# Patient Record
Sex: Female | Born: 1955 | Race: White | Hispanic: No | Marital: Married | Smoking: Former smoker
Health system: Southern US, Community
[De-identification: ages and names within clinical notes are randomized; demographics above are authoritative.]

## PROBLEM LIST (undated history)

## (undated) DIAGNOSIS — B009 Herpesviral infection, unspecified: Secondary | ICD-10-CM

## (undated) DIAGNOSIS — T7840XA Allergy, unspecified, initial encounter: Secondary | ICD-10-CM

## (undated) DIAGNOSIS — H905 Unspecified sensorineural hearing loss: Secondary | ICD-10-CM

## (undated) DIAGNOSIS — Z780 Asymptomatic menopausal state: Secondary | ICD-10-CM

## (undated) DIAGNOSIS — I10 Essential (primary) hypertension: Secondary | ICD-10-CM

## (undated) DIAGNOSIS — K635 Polyp of colon: Secondary | ICD-10-CM

## (undated) DIAGNOSIS — M8580 Other specified disorders of bone density and structure, unspecified site: Secondary | ICD-10-CM

## (undated) HISTORY — DX: Polyp of colon: K63.5

## (undated) HISTORY — DX: Asymptomatic menopausal state: Z78.0

## (undated) HISTORY — DX: Other specified disorders of bone density and structure, unspecified site: M85.80

## (undated) HISTORY — DX: Unspecified sensorineural hearing loss: H90.5

## (undated) HISTORY — DX: Allergy, unspecified, initial encounter: T78.40XA

## (undated) HISTORY — DX: Herpesviral infection, unspecified: B00.9

---

## 1975-06-06 HISTORY — PX: BREAST BIOPSY: SHX20

## 1991-06-06 DIAGNOSIS — H905 Unspecified sensorineural hearing loss: Secondary | ICD-10-CM

## 1991-06-06 HISTORY — DX: Unspecified sensorineural hearing loss: H90.5

## 2005-09-01 ENCOUNTER — Ambulatory Visit: Payer: Self-pay | Admitting: Unknown Physician Specialty

## 2009-06-05 LAB — HM PAP SMEAR: HM Pap smear: NORMAL

## 2010-11-23 LAB — HM COLONOSCOPY

## 2012-07-23 ENCOUNTER — Encounter: Payer: Self-pay | Admitting: Internal Medicine

## 2012-07-25 ENCOUNTER — Ambulatory Visit (INDEPENDENT_AMBULATORY_CARE_PROVIDER_SITE_OTHER): Payer: BC Managed Care – PPO | Admitting: Internal Medicine

## 2012-07-25 ENCOUNTER — Encounter: Payer: Self-pay | Admitting: Internal Medicine

## 2012-07-25 VITALS — BP 120/64 | HR 76 | Temp 98.4°F | Ht 63.5 in | Wt 114.0 lb

## 2012-07-25 DIAGNOSIS — R7309 Other abnormal glucose: Secondary | ICD-10-CM

## 2012-07-25 DIAGNOSIS — R739 Hyperglycemia, unspecified: Secondary | ICD-10-CM

## 2012-07-25 DIAGNOSIS — Z860100 Personal history of colon polyps, unspecified: Secondary | ICD-10-CM

## 2012-07-25 DIAGNOSIS — M899 Disorder of bone, unspecified: Secondary | ICD-10-CM

## 2012-07-25 DIAGNOSIS — R5383 Other fatigue: Secondary | ICD-10-CM

## 2012-07-25 DIAGNOSIS — Z1239 Encounter for other screening for malignant neoplasm of breast: Secondary | ICD-10-CM

## 2012-07-25 DIAGNOSIS — R5381 Other malaise: Secondary | ICD-10-CM

## 2012-07-25 DIAGNOSIS — Z13220 Encounter for screening for lipoid disorders: Secondary | ICD-10-CM

## 2012-08-04 ENCOUNTER — Encounter: Payer: Self-pay | Admitting: Internal Medicine

## 2012-08-04 DIAGNOSIS — M8580 Other specified disorders of bone density and structure, unspecified site: Secondary | ICD-10-CM | POA: Insufficient documentation

## 2012-08-04 NOTE — Assessment & Plan Note (Signed)
Schedule a bone density.  Continue weight bearing exercise, calcium and vitamin D.

## 2012-08-04 NOTE — Assessment & Plan Note (Signed)
Last a1c 5.7.  Low carb diet.  Follow.   

## 2012-08-04 NOTE — Progress Notes (Signed)
Subjective:    [name], female    DOB: [birth year], 57 y.o.   MRN: [id number]  HPI 57 year old female with past history of osteopenia and hearing loss who comes in today to establish care.  Previously seeing Dr Burnadette Pop.  She was also seeing Dr Barnabas Lister.  She is trying to watch what she eats.  Walking.  Had her last bone density three years ago.  Has osteopenia.  On vitamin D.  Some vaginal itching and dryness.  Uses vagifem.  Has had nasal herpes.  Takes acyclovir.  Has seen Dr Markham Jordan.  Last colonoscopy 2012.  Recommended follow up in three years.  Mammograms are done at [facility].  All have been normal.  A1c just slightly elevated.    Past Medical History  Diagnosis Date  . HSV-1 (herpes simplex virus 1) infection   . Hearing loss, sensorineural 1993  . Osteopenia   . Postmenopausal   . Allergy   . Colon polyps     Outpatient Encounter Prescriptions as of 07/25/2012  Medication Sig Dispense Refill  . alendronate (FOSAMAX) 70 MG tablet Take 70 mg by mouth every 7 (seven) days. Take with a full glass of water on an empty stomach.      . Calcium Citrate (CITRACAL PO) Take 1 tablet by mouth daily.      . Estradiol (VAGIFEM) 10 MCG TABS Insert one tablet into vagina twice a day.      . IBUPROFEN ADVANCED FORMULA PO Take by mouth daily.      Marland Kitchen LORATADINE ALLERGY RELIEF PO Take by mouth daily.      Marland Kitchen LYSINE PO Take by mouth daily.      . Omega-3 Fatty Acids (FISH OIL) 600 MG CAPS Take 1 tablet by mouth daily.      . fluticasone (FLONASE) 50 MCG/ACT nasal spray Spray 2 sprays into each nostril once a day.      . valACYclovir (VALTREX) 500 MG tablet Take 500 mg by mouth daily.       No facility-administered encounter medications on file as of 07/25/2012.    Review of Systems Patient denies any headache, lightheadedness or dizziness.  No significant sinus or allergy symptoms.  No chest pain, tightness or palpitations.  No increased shortness of breath, cough or  congestion.  No nausea or vomiting.  No acid reflux.  No abdominal pain or cramping.  No bowel change, such as diarrhea, constipation, BRBPR or melana.  No urine change.  Some vaginal itching and dryness.  Uses vagifem.  Some fatigue, but overall doing well.        Objective:   Physical Exam Filed Vitals:   07/25/12 1454  BP: 120/64  Pulse: 76  Temp: 98.4 F (49.13 C)   57 year old female in no acute distress.   HEENT:  Nares- clear.  Oropharynx - without lesions. NECK:  Supple.  Nontender.  No audible bruit.  HEART:  Appears to be regular.  1/VI systolic murmur.  LUNGS:  No crackles or wheezing audible.  Respirations even and unlabored.  RADIAL PULSE:  Equal bilaterally.   ABDOMEN:  Soft, nontender.  Bowel sounds present and normal.  No audible abdominal bruit.   EXTREMITIES:  No increased edema present.  DP pulses palpable and equal bilaterally.      SKIN:  No lesions.       Assessment & Plan:  FATIGUE.  Check cbc, met c and tsh.   VAGINAL DRYNESS.  Using vagifem.  Follow.   HEART MURMUR.  States she was told she had a murmur years ago.  Follow.  Asymptomatic.   HEALTH MAINTENANCE.  Schedule a physical next visit.  Obtain records.  Colonoscopy 11/23/10 - adenomatous polyps.  Recommended follow up colonoscopy in three years.

## 2012-08-04 NOTE — Assessment & Plan Note (Signed)
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  Recommended follow up in three years.

## 2012-08-07 ENCOUNTER — Telehealth: Payer: Self-pay | Admitting: *Deleted

## 2012-08-07 ENCOUNTER — Other Ambulatory Visit (INDEPENDENT_AMBULATORY_CARE_PROVIDER_SITE_OTHER): Payer: BC Managed Care – PPO

## 2012-08-07 DIAGNOSIS — M8580 Other specified disorders of bone density and structure, unspecified site: Secondary | ICD-10-CM

## 2012-08-07 DIAGNOSIS — R5381 Other malaise: Secondary | ICD-10-CM

## 2012-08-07 DIAGNOSIS — Z13220 Encounter for screening for lipoid disorders: Secondary | ICD-10-CM

## 2012-08-07 DIAGNOSIS — R7309 Other abnormal glucose: Secondary | ICD-10-CM

## 2012-08-07 DIAGNOSIS — R739 Hyperglycemia, unspecified: Secondary | ICD-10-CM

## 2012-08-07 LAB — COMPREHENSIVE METABOLIC PANEL
ALT: 24 U/L (ref 0–35)
BUN: 19 mg/dL (ref 6–23)
CO2: 29 mEq/L (ref 19–32)
Creatinine, Ser: 0.9 mg/dL (ref 0.4–1.2)
GFR: 68.36 mL/min (ref >60.00)
Total Bilirubin: 0.6 mg/dL (ref 0.3–1.2)

## 2012-08-07 LAB — CBC WITH DIFFERENTIAL/PLATELET
Basophils Absolute: 0 10*3/uL (ref 0.0–0.1)
HCT: 39.7 % (ref 36.0–46.0)
Hemoglobin: 13.5 g/dL (ref 12.0–15.0)
Lymphs Abs: 1.9 10*3/uL (ref 0.7–4.0)
MCHC: 33.9 g/dL (ref 30.0–36.0)
Monocytes Relative: 5.4 % (ref 3.0–12.0)
Neutro Abs: 4 10*3/uL (ref 1.4–7.7)
RDW: 13.5 % (ref 11.5–14.6)

## 2012-08-07 LAB — LIPID PANEL
Total CHOL/HDL Ratio: 3
Triglycerides: 68 mg/dL (ref 0.0–149.0)
VLDL: 13.6 mg/dL (ref 0.0–40.0)

## 2012-08-07 LAB — LDL CHOLESTEROL, DIRECT: Direct LDL: 113.2 mg/dL

## 2012-08-07 NOTE — Telephone Encounter (Signed)
Called patient with bone density results.

## 2012-08-08 ENCOUNTER — Telehealth: Payer: Self-pay | Admitting: Internal Medicine

## 2012-08-08 LAB — VITAMIN D 25 HYDROXY (VIT D DEFICIENCY, FRACTURES): Vit D, 25-Hydroxy: 47 ng/mL (ref 30–89)

## 2012-08-08 NOTE — Telephone Encounter (Signed)
Notified pt of labs via mychart

## 2012-08-16 ENCOUNTER — Encounter: Payer: Self-pay | Admitting: Internal Medicine

## 2012-08-23 ENCOUNTER — Encounter: Payer: Self-pay | Admitting: Internal Medicine

## 2012-09-30 ENCOUNTER — Encounter: Payer: Self-pay | Admitting: Internal Medicine

## 2012-09-30 ENCOUNTER — Ambulatory Visit (INDEPENDENT_AMBULATORY_CARE_PROVIDER_SITE_OTHER): Payer: BC Managed Care – PPO | Admitting: Internal Medicine

## 2012-09-30 ENCOUNTER — Other Ambulatory Visit (HOSPITAL_COMMUNITY)
Admission: RE | Admit: 2012-09-30 | Discharge: 2012-09-30 | Disposition: A | Discharge status: Home / Self Care | Payer: BC Managed Care – PPO | Source: Ambulatory Visit | Attending: Internal Medicine | Admitting: Internal Medicine

## 2012-09-30 VITALS — BP 122/80 | HR 57 | Temp 98.2°F | Ht 63.25 in | Wt 114.2 lb

## 2012-09-30 DIAGNOSIS — Z86010 Personal history of colonic polyps: Secondary | ICD-10-CM

## 2012-09-30 DIAGNOSIS — Z1151 Encounter for screening for human papillomavirus (HPV): Secondary | ICD-10-CM | POA: Insufficient documentation

## 2012-09-30 DIAGNOSIS — R7309 Other abnormal glucose: Secondary | ICD-10-CM

## 2012-09-30 DIAGNOSIS — M8580 Other specified disorders of bone density and structure, unspecified site: Secondary | ICD-10-CM

## 2012-09-30 DIAGNOSIS — Z01419 Encounter for gynecological examination (general) (routine) without abnormal findings: Secondary | ICD-10-CM | POA: Insufficient documentation

## 2012-09-30 DIAGNOSIS — M949 Disorder of cartilage, unspecified: Secondary | ICD-10-CM

## 2012-09-30 DIAGNOSIS — Z124 Encounter for screening for malignant neoplasm of cervix: Secondary | ICD-10-CM

## 2012-09-30 DIAGNOSIS — R739 Hyperglycemia, unspecified: Secondary | ICD-10-CM

## 2012-10-01 ENCOUNTER — Encounter: Payer: Self-pay | Admitting: Internal Medicine

## 2012-10-01 NOTE — Progress Notes (Signed)
Subjective:    [name], female    DOB: 12-23-1955, 57 y.o.   MRN: [id number]  HPI 57 year old female with past history of osteopenia and hearing loss who comes in today for her complete physical exam. States overall she feels she is doing relatively well.  Using Nasacort and this is helping her allergy symptoms.  No chest congestion or cough.  No acid reflux.  Has had some vaginal itching and dryness issues.  Uses vagifem.  Has had nasal herpes.  Takes acyclovir.  Has seen Dr Markham Jordan.  Last colonoscopy 2012.  Recommended follow up in three years.  Mammograms are done at [facility].  All have been normal.  States she just had a follow up mammogram a couple of months ago, and everything checked out fine.      Past Medical History  Diagnosis Date  . HSV-1 (herpes simplex virus 1) infection   . Hearing loss, sensorineural 1993  . Osteopenia   . Postmenopausal   . Allergy   . Colon polyps     Outpatient Encounter Prescriptions as of 09/30/2012  Medication Sig Dispense Refill  . Calcium Citrate (CITRACAL PO) Take 1 tablet by mouth daily.      . Estradiol (VAGIFEM) 10 MCG TABS Insert one tablet into vagina twice a day.      . IBUPROFEN ADVANCED FORMULA PO Take by mouth as needed.       Marland Kitchen LORATADINE ALLERGY RELIEF PO Take by mouth daily.      . Omega-3 Fatty Acids (FISH OIL) 600 MG CAPS Take 1 tablet by mouth daily.      Marland Kitchen triamcinolone (NASACORT) 55 MCG/ACT nasal inhaler Place 2 sprays into the nose daily.      Marland Kitchen LYSINE PO Take by mouth daily.      . valACYclovir (VALTREX) 500 MG tablet Take 500 mg by mouth daily.      . [DISCONTINUED] alendronate (FOSAMAX) 70 MG tablet Take 70 mg by mouth every 7 (seven) days. Take with a full glass of water on an empty stomach.      . [DISCONTINUED] fluticasone (FLONASE) 50 MCG/ACT nasal spray Spray 2 sprays into each nostril once a day.       No facility-administered encounter medications on file as of 09/30/2012.    Review of  Systems Patient denies any headache, lightheadedness or dizziness.  No significant sinus or allergy symptoms. Does well with Nasacort.  No chest pain, tightness or palpitations.  No increased shortness of breath, cough or congestion.  No nausea or vomiting.  No acid reflux.  No abdominal pain or cramping.  No bowel change, such as diarrhea, constipation, BRBPR or melana.  No urine change. Some vaginal itching and dryness.  Uses vagifem.         Objective:   Physical Exam  Filed Vitals:   09/30/12 1513  BP: 122/80  Pulse: 57  Temp: 98.2 F (57.27 C)   57 year old female in no acute distress.   HEENT:  Nares- clear.  Oropharynx - without lesions. NECK:  Supple.  Nontender.  No audible bruit.  HEART:  Appears to be regular. LUNGS:  No crackles or wheezing audible.  Respirations even and unlabored.  RADIAL PULSE:  Equal bilaterally.    BREASTS:  No nipple discharge or nipple retraction present.  Could not appreciate any distinct nodules or axillary adenopathy.  ABDOMEN:  Soft, nontender.  Bowel sounds present and normal.  No audible abdominal bruit.  GU:  Normal external genitalia.  Vaginal vault without lesions.  Cervix identified.  Pap performed. Could not appreciate any adnexal masses or tenderness.   RECTAL:  Heme negative.   EXTREMITIES:  No increased edema present.  DP pulses palpable and equal bilaterally.          Assessment & Plan:  VAGINAL DRYNESS.  Using vagifem.  Follow.   HEART MURMUR.  States she was told she had a murmur years ago.  Follow.  Asymptomatic.   HEALTH MAINTENANCE.  Physical today.  Pap today.  Colonoscopy 11/23/10 - adenomatous polyps.  Recommended follow up colonoscopy in three years.  Obtain results of recent mammogram.

## 2012-10-01 NOTE — Assessment & Plan Note (Signed)
Recent bone density revealed some improvement.  Back normal.  Hip = -2.2.  Fosamax holiday.  Continue weight bearing exercise, calcium and vitamin D.  Recent vitamin D level wnl.

## 2012-10-01 NOTE — Assessment & Plan Note (Signed)
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  Recommended follow up in three years.

## 2012-10-01 NOTE — Assessment & Plan Note (Signed)
Last a1c 5.7.  Low carb diet.  Follow.   

## 2012-10-04 ENCOUNTER — Encounter: Payer: Self-pay | Admitting: Internal Medicine

## 2012-10-10 ENCOUNTER — Encounter: Payer: Self-pay | Admitting: Internal Medicine

## 2012-12-03 ENCOUNTER — Other Ambulatory Visit: Payer: Self-pay | Admitting: Internal Medicine

## 2013-10-02 ENCOUNTER — Encounter: Payer: Self-pay | Admitting: Internal Medicine

## 2013-10-02 ENCOUNTER — Ambulatory Visit (INDEPENDENT_AMBULATORY_CARE_PROVIDER_SITE_OTHER): Payer: BC Managed Care – PPO | Admitting: Internal Medicine

## 2013-10-02 VITALS — BP 130/70 | HR 71 | Temp 98.2°F | Ht 63.75 in | Wt 114.0 lb

## 2013-10-02 DIAGNOSIS — R739 Hyperglycemia, unspecified: Secondary | ICD-10-CM

## 2013-10-02 DIAGNOSIS — R7309 Other abnormal glucose: Secondary | ICD-10-CM

## 2013-10-02 DIAGNOSIS — L989 Disorder of the skin and subcutaneous tissue, unspecified: Secondary | ICD-10-CM

## 2013-10-02 DIAGNOSIS — M899 Disorder of bone, unspecified: Secondary | ICD-10-CM

## 2013-10-02 DIAGNOSIS — Z86010 Personal history of colonic polyps: Secondary | ICD-10-CM

## 2013-10-02 DIAGNOSIS — M8580 Other specified disorders of bone density and structure, unspecified site: Secondary | ICD-10-CM

## 2013-10-02 DIAGNOSIS — M949 Disorder of cartilage, unspecified: Secondary | ICD-10-CM

## 2013-10-02 DIAGNOSIS — Z1239 Encounter for other screening for malignant neoplasm of breast: Secondary | ICD-10-CM

## 2013-10-02 NOTE — Progress Notes (Signed)
Pre visit review using our clinic review tool, if applicable. No additional management support is needed unless otherwise documented below in the visit note. 

## 2013-10-02 NOTE — Progress Notes (Signed)
Subjective:    [name], female    DOB: March 13, 1956, 58 y.o.   MRN: [id number]  HPI 58 year old female with past history of osteopenia and hearing loss who comes in today for her complete physical exam. States overall she feels she is doing relatively well.  Using Nasacort and this is helping her allergy symptoms.  No chest congestion or cough.  No acid reflux.  Has had some vaginal itching and dryness issues.  Uses vagifem.  Has had nasal herpes.  Takes acyclovir.  Has seen Dr Tiffany Kocher.  Last colonoscopy 2012.  Recommended follow up in three years.  Overall she feels she is doing well.  Planning to retire in the next couple of years.  Has a persistent left leg lesion.       Past Medical History  Diagnosis Date  . HSV-1 (herpes simplex virus 1) infection   . Hearing loss, sensorineural 1993  . Osteopenia   . Postmenopausal   . Allergy   . Colon polyps     Outpatient Encounter Prescriptions as of 10/02/2013  Medication Sig  . Calcium Citrate (CITRACAL PO) Take 1 tablet by mouth daily.  . IBUPROFEN ADVANCED FORMULA PO Take by mouth as needed.   Marland Kitchen LORATADINE ALLERGY RELIEF PO Take by mouth daily.  Marland Kitchen LYSINE PO Take by mouth daily.  . Omega-3 Fatty Acids (FISH OIL) 600 MG CAPS Take 1 tablet by mouth daily.  Marland Kitchen triamcinolone (NASACORT) 55 MCG/ACT nasal inhaler Place 2 sprays into the nose daily.  Marland Kitchen VAGIFEM 10 MCG TABS vaginal tablet INSERT 1 TABLET INTO VAGINA TWICE A WEEK AS DIRECTED  . valACYclovir (VALTREX) 500 MG tablet Take 500 mg by mouth daily.    Review of Systems Patient denies any headache, lightheadedness or dizziness.  No significant sinus or allergy symptoms.  Does well with Nasacort.  No chest pain, tightness or palpitations.  No increased shortness of breath, cough or congestion.  No nausea or vomiting.  No acid reflux.  No abdominal pain or cramping.  No bowel change, such as diarrhea, constipation, BRBPR or melana.  No urine change. Some vaginal itching and  dryness.  Uses vagifem.         Objective:   Physical Exam  Filed Vitals:   10/02/13 1515  BP: 130/70  Pulse: 71  Temp: 98.2 F (25.70 C)   58 year old female in no acute distress.   HEENT:  Nares- clear.  Oropharynx - without lesions. NECK:  Supple.  Nontender.  No audible bruit.  HEART:  Appears to be regular. LUNGS:  No crackles or wheezing audible.  Respirations even and unlabored.  RADIAL PULSE:  Equal bilaterally.    BREASTS:  No nipple discharge or nipple retraction present.  Could not appreciate any distinct nodules or axillary adenopathy.  ABDOMEN:  Soft, nontender.  Bowel sounds present and normal.  No audible abdominal bruit.  GU:  Normal external genitalia.  Vaginal vault without lesions.  Cervix identified.  Pap performed. Could not appreciate any adnexal masses or tenderness.   RECTAL:  Heme negative.   EXTREMITIES:  No increased edema present.  DP pulses palpable and equal bilaterally.          Assessment & Plan:  VAGINAL DRYNESS.  Using vagifem.  Follow.   HEART MURMUR.  States she was told she had a murmur years ago.  Follow.  Asymptomatic.   HEALTH MAINTENANCE.  Physical today.  Pap 4/14 negative with negative HPV.  Colonoscopy 11/23/10 -  adenomatous polyps.  Recommended follow up colonoscopy in three years.  Mammogram 09/09/12 [facility] [facility].  Schedule f/u mammogram.

## 2013-10-05 ENCOUNTER — Encounter: Payer: Self-pay | Admitting: Internal Medicine

## 2013-10-05 DIAGNOSIS — L989 Disorder of the skin and subcutaneous tissue, unspecified: Secondary | ICD-10-CM | POA: Insufficient documentation

## 2013-10-05 NOTE — Assessment & Plan Note (Signed)
Most recent bone density revealed some improvement.  Back normal.  Hip = -2.2.  Fosamax holiday.  Continue weight bearing exercise, calcium and vitamin D.  Recent vitamin D level wnl.  Plan for f/u bone density next year.

## 2013-10-05 NOTE — Assessment & Plan Note (Signed)
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  Recommended follow up in three years.  Should get her notice soon.

## 2013-10-05 NOTE — Assessment & Plan Note (Signed)
Last a1c 5.7.  Low carb diet.  Follow.

## 2013-10-05 NOTE — Assessment & Plan Note (Signed)
Persistent left leg lesion.  Apply bactroban.  If persistent, will require dermatology referral.

## 2013-10-08 ENCOUNTER — Encounter: Payer: Self-pay | Admitting: *Deleted

## 2013-10-09 ENCOUNTER — Encounter: Payer: Self-pay | Admitting: *Deleted

## 2013-10-21 ENCOUNTER — Other Ambulatory Visit (INDEPENDENT_AMBULATORY_CARE_PROVIDER_SITE_OTHER): Payer: BC Managed Care – PPO

## 2013-10-21 DIAGNOSIS — R739 Hyperglycemia, unspecified: Secondary | ICD-10-CM

## 2013-10-21 DIAGNOSIS — M8580 Other specified disorders of bone density and structure, unspecified site: Secondary | ICD-10-CM

## 2013-10-21 DIAGNOSIS — R7309 Other abnormal glucose: Secondary | ICD-10-CM

## 2013-10-21 DIAGNOSIS — M899 Disorder of bone, unspecified: Secondary | ICD-10-CM

## 2013-10-21 DIAGNOSIS — M949 Disorder of cartilage, unspecified: Secondary | ICD-10-CM

## 2013-10-21 LAB — COMPREHENSIVE METABOLIC PANEL
ALK PHOS: 59 U/L (ref 39–117)
ALT: 23 U/L (ref 0–35)
AST: 27 U/L (ref 0–37)
Albumin: 4 g/dL (ref 3.5–5.2)
BILIRUBIN TOTAL: 0.6 mg/dL (ref 0.2–1.2)
BUN: 21 mg/dL (ref 6–23)
CO2: 27 mEq/L (ref 19–32)
Calcium: 9.3 mg/dL (ref 8.4–10.5)
Chloride: 105 mEq/L (ref 96–112)
Creatinine, Ser: 0.9 mg/dL (ref 0.4–1.2)
GFR: 72.72 mL/min (ref >60.00)
GLUCOSE: 85 mg/dL (ref 70–99)
Potassium: 4.2 mEq/L (ref 3.5–5.1)
SODIUM: 141 meq/L (ref 135–145)
TOTAL PROTEIN: 6.9 g/dL (ref 6.0–8.3)

## 2013-10-21 LAB — TSH: TSH: 4.08 u[IU]/mL (ref 0.35–4.50)

## 2013-10-21 LAB — CBC WITH DIFFERENTIAL/PLATELET
Basophils Absolute: 0 10*3/uL (ref 0.0–0.1)
Basophils Relative: 0.4 % (ref 0.0–3.0)
Eosinophils Absolute: 0.3 10*3/uL (ref 0.0–0.7)
Eosinophils Relative: 4.3 % (ref 0.0–5.0)
HEMATOCRIT: 42.5 % (ref 36.0–46.0)
Hemoglobin: 14.2 g/dL (ref 12.0–15.0)
LYMPHS ABS: 2 10*3/uL (ref 0.7–4.0)
Lymphocytes Relative: 33.4 % (ref 12.0–46.0)
MCHC: 33.4 g/dL (ref 30.0–36.0)
MCV: 90.2 fl (ref 78.0–100.0)
MONO ABS: 0.4 10*3/uL (ref 0.1–1.0)
Monocytes Relative: 5.9 % (ref 3.0–12.0)
Neutro Abs: 3.4 10*3/uL (ref 1.4–7.7)
Neutrophils Relative %: 56 % (ref 43.0–77.0)
Platelets: 246 10*3/uL (ref 150.0–400.0)
RBC: 4.71 Mil/uL (ref 3.87–5.11)
RDW: 13.2 % (ref 11.5–15.5)
WBC: 6.1 10*3/uL (ref 4.0–10.5)

## 2013-10-21 LAB — LIPID PANEL
Cholesterol: 218 mg/dL — ABNORMAL HIGH (ref 0–200)
HDL: 88.5 mg/dL (ref >39.00)
LDL Cholesterol: 123 mg/dL — ABNORMAL HIGH (ref 0–99)
TRIGLYCERIDES: 33 mg/dL (ref 0.0–149.0)
Total CHOL/HDL Ratio: 2
VLDL: 6.6 mg/dL (ref 0.0–40.0)

## 2013-10-21 LAB — HM MAMMOGRAPHY: HM Mammogram: NEGATIVE

## 2013-10-21 LAB — HEMOGLOBIN A1C: Hgb A1c MFr Bld: 5.9 % (ref 4.6–6.5)

## 2013-10-22 ENCOUNTER — Encounter: Payer: Self-pay | Admitting: Internal Medicine

## 2013-10-22 LAB — VITAMIN D 25 HYDROXY (VIT D DEFICIENCY, FRACTURES): Vit D, 25-Hydroxy: 62 ng/mL (ref 30–89)

## 2013-10-30 ENCOUNTER — Encounter: Payer: Self-pay | Admitting: Internal Medicine

## 2013-11-27 ENCOUNTER — Other Ambulatory Visit: Payer: Self-pay | Admitting: Internal Medicine

## 2014-01-14 ENCOUNTER — Encounter: Payer: Self-pay | Admitting: Internal Medicine

## 2014-01-14 DIAGNOSIS — Z86010 Personal history of colonic polyps: Secondary | ICD-10-CM

## 2014-01-26 ENCOUNTER — Encounter: Payer: Self-pay | Admitting: Internal Medicine

## 2014-01-26 DIAGNOSIS — Z86010 Personal history of colonic polyps: Secondary | ICD-10-CM

## 2014-10-05 ENCOUNTER — Encounter: Payer: BC Managed Care – PPO | Admitting: Internal Medicine

## 2014-11-09 ENCOUNTER — Other Ambulatory Visit: Payer: Self-pay | Admitting: Internal Medicine

## 2014-11-09 NOTE — Telephone Encounter (Signed)
Last visit 10/02/13. Has appt scheduled 01/13/15, ok refill until appt?

## 2014-11-10 NOTE — Telephone Encounter (Signed)
Ordered last by our office, using twice weekly. Rx sent to pharmacy by escript

## 2014-11-10 NOTE — Telephone Encounter (Signed)
Please clarify who has been refilling and how often she used the medication.  Thanks

## 2015-01-13 ENCOUNTER — Ambulatory Visit (INDEPENDENT_AMBULATORY_CARE_PROVIDER_SITE_OTHER): Payer: BC Managed Care – PPO | Admitting: Internal Medicine

## 2015-01-13 ENCOUNTER — Encounter: Payer: Self-pay | Admitting: Internal Medicine

## 2015-01-13 VITALS — BP 118/76 | HR 56 | Temp 97.9°F | Ht 63.75 in | Wt 110.1 lb

## 2015-01-13 DIAGNOSIS — Z86010 Personal history of colonic polyps: Secondary | ICD-10-CM

## 2015-01-13 DIAGNOSIS — Z1239 Encounter for other screening for malignant neoplasm of breast: Secondary | ICD-10-CM

## 2015-01-13 DIAGNOSIS — R739 Hyperglycemia, unspecified: Secondary | ICD-10-CM | POA: Diagnosis not present

## 2015-01-13 DIAGNOSIS — M8580 Other specified disorders of bone density and structure, unspecified site: Secondary | ICD-10-CM

## 2015-01-13 DIAGNOSIS — Z0000 Encounter for general adult medical examination without abnormal findings: Secondary | ICD-10-CM | POA: Diagnosis not present

## 2015-01-13 DIAGNOSIS — Z13220 Encounter for screening for lipoid disorders: Secondary | ICD-10-CM

## 2015-01-13 LAB — CBC WITH DIFFERENTIAL/PLATELET
BASOS PCT: 0.4 % (ref 0.0–3.0)
Basophils Absolute: 0 10*3/uL (ref 0.0–0.1)
EOS ABS: 0.1 10*3/uL (ref 0.0–0.7)
Eosinophils Relative: 1.7 % (ref 0.0–5.0)
HCT: 41.7 % (ref 36.0–46.0)
HEMOGLOBIN: 13.9 g/dL (ref 12.0–15.0)
LYMPHS ABS: 1.7 10*3/uL (ref 0.7–4.0)
Lymphocytes Relative: 27.8 % (ref 12.0–46.0)
MCHC: 33.4 g/dL (ref 30.0–36.0)
MCV: 89.8 fl (ref 78.0–100.0)
MONO ABS: 0.4 10*3/uL (ref 0.1–1.0)
Monocytes Relative: 6.4 % (ref 3.0–12.0)
NEUTROS PCT: 63.7 % (ref 43.0–77.0)
Neutro Abs: 3.9 10*3/uL (ref 1.4–7.7)
Platelets: 228 10*3/uL (ref 150.0–400.0)
RBC: 4.64 Mil/uL (ref 3.87–5.11)
RDW: 13.5 % (ref 11.5–15.5)
WBC: 6.2 10*3/uL (ref 4.0–10.5)

## 2015-01-13 LAB — COMPREHENSIVE METABOLIC PANEL
ALK PHOS: 57 U/L (ref 39–117)
ALT: 20 U/L (ref 0–35)
AST: 23 U/L (ref 0–37)
Albumin: 4.2 g/dL (ref 3.5–5.2)
BUN: 19 mg/dL (ref 6–23)
CO2: 30 mEq/L (ref 19–32)
Calcium: 9.6 mg/dL (ref 8.4–10.5)
Chloride: 104 mEq/L (ref 96–112)
Creatinine, Ser: 0.81 mg/dL (ref 0.40–1.20)
GFR: 76.56 mL/min (ref >60.00)
Glucose, Bld: 93 mg/dL (ref 70–99)
Potassium: 4.3 mEq/L (ref 3.5–5.1)
Sodium: 141 mEq/L (ref 135–145)
TOTAL PROTEIN: 7.2 g/dL (ref 6.0–8.3)
Total Bilirubin: 0.4 mg/dL (ref 0.2–1.2)

## 2015-01-13 LAB — VITAMIN D 25 HYDROXY (VIT D DEFICIENCY, FRACTURES): VITD: 50.55 ng/mL (ref 30.00–100.00)

## 2015-01-13 LAB — LIPID PANEL
CHOLESTEROL: 215 mg/dL — AB (ref 0–200)
HDL: 81.5 mg/dL (ref >39.00)
LDL Cholesterol: 122 mg/dL — ABNORMAL HIGH (ref 0–99)
NONHDL: 133.13
Total CHOL/HDL Ratio: 3
Triglycerides: 55 mg/dL (ref 0.0–149.0)
VLDL: 11 mg/dL (ref 0.0–40.0)

## 2015-01-13 LAB — TSH: TSH: 2.65 u[IU]/mL (ref 0.35–4.50)

## 2015-01-13 LAB — HEMOGLOBIN A1C: HEMOGLOBIN A1C: 5.8 % (ref 4.6–6.5)

## 2015-01-13 NOTE — Assessment & Plan Note (Signed)
Physical today 01/13/15.  PAP 09/2012 - negative with negative HPV.  Colonoscopy 2015 recommended f/u colonoscopy in 2020.  Plan for f/u bone density next year.

## 2015-01-13 NOTE — Assessment & Plan Note (Signed)
Colonoscopy as outlined in overview.  Recommended f/u in 01/2019.

## 2015-01-13 NOTE — Progress Notes (Signed)
[name], female   DOB: May 30, 1956, 59 y.o.   MRN: [id number]   Subjective:    [name], female    DOB: 1956-04-18, 59 y.o.   MRN: [id number]  HPI  Patient here to follow up on these medical issues as well as for a complete physical exam.  She is staying active.  No cardiac symptoms with increased activity or exertion.  No sob.  No acid reflux.  No abdominal pain or cramping.  Bowels stable.  Some gas.  Discussed using probiotics.  States her weight is down because of increased appetite.  She has had a good appetite.  No nausea or vomiting.    Past Medical History  Diagnosis Date  . HSV-1 (herpes simplex virus 1) infection   . Hearing loss, sensorineural 1993  . Osteopenia   . Postmenopausal   . Allergy   . Colon polyps     Outpatient Encounter Prescriptions as of 01/13/2015  Medication Sig  . Calcium Citrate (CITRACAL PO) Take 1 tablet by mouth daily.  . IBUPROFEN ADVANCED FORMULA PO Take by mouth as needed.   . Omega-3 Fatty Acids (FISH OIL) 600 MG CAPS Take 1 tablet by mouth daily.  Marland Kitchen triamcinolone (NASACORT) 55 MCG/ACT nasal inhaler Place 2 sprays into the nose daily.  Marland Kitchen VAGIFEM 10 MCG TABS vaginal tablet INSERT 1 TABLET INTO VAGINA TWICE A WEEK AS DIRECTED  . LORATADINE ALLERGY RELIEF PO Take by mouth daily.  Marland Kitchen LYSINE PO Take by mouth daily.  . valACYclovir (VALTREX) 500 MG tablet Take 500 mg by mouth daily.   No facility-administered encounter medications on file as of 01/13/2015.    Review of Systems  Constitutional: Negative for appetite change.       Weight down, but she relates this to exercise.  Good appetite.    HENT: Negative for congestion and sinus pressure.   Eyes: Negative for pain and visual disturbance.  Respiratory: Negative for cough, chest tightness and shortness of breath.   Cardiovascular: Negative for chest pain, palpitations and leg swelling.  Gastrointestinal: Negative for nausea, vomiting, abdominal pain and diarrhea.         Increased gas.   Genitourinary: Negative for dysuria and difficulty urinating.  Musculoskeletal: Negative for back pain and joint swelling.  Skin: Negative for color change and rash.  Neurological: Negative for dizziness, light-headedness and headaches.  Hematological: Negative for adenopathy. Does not bruise/bleed easily.  Psychiatric/Behavioral: Negative for dysphoric mood and agitation.       Objective:     Blood pressure recheck:  128/76, pulse 64  Physical Exam  Constitutional: She is oriented to person, place, and time. She appears well-developed and well-nourished.  HENT:  Nose: Nose normal.  Mouth/Throat: Oropharynx is clear and moist.  Eyes: Right eye exhibits no discharge. Left eye exhibits no discharge. No scleral icterus.  Neck: Neck supple. No thyromegaly present.  Cardiovascular: Normal rate and regular rhythm.   Pulmonary/Chest: Breath sounds normal. No accessory muscle usage. No tachypnea. No respiratory distress. She has no decreased breath sounds. She has no wheezes. She has no rhonchi. Right breast exhibits no inverted nipple, no mass, no nipple discharge and no tenderness (no axillary adenopathy). Left breast exhibits no inverted nipple, no mass, no nipple discharge and no tenderness (no axilarry adenopathy).  Abdominal: Soft. Bowel sounds are normal. There is no tenderness.  Musculoskeletal: She exhibits no edema or tenderness.  Lymphadenopathy:    She has no cervical adenopathy.  Neurological: She  is alert and oriented to person, place, and time.  Skin: Skin is warm. No rash noted.  Psychiatric: She has a normal mood and affect. Her behavior is normal.    BP 118/76 mmHg  Pulse 56  Temp(Src) 97.9 F (36.6 C) (Oral)  Ht 5' 3.75" (1.619 m)  Wt 110 lb 2 oz (49.952 kg)  BMI 19.06 kg/m2  SpO2 94%  LMP 07/26/2003 Wt Readings from Last 3 Encounters:  01/13/15 110 lb 2 oz (49.952 kg)  10/02/13 114 lb (51.71 kg)  09/30/12 114 lb 4 oz (51.823 kg)      Lab Results  Component Value Date   WBC 6.1 10/21/2013   HGB 14.2 10/21/2013   HCT 42.5 10/21/2013   PLT 246.0 10/21/2013   GLUCOSE 85 10/21/2013   CHOL 218* 10/21/2013   TRIG 33.0 10/21/2013   HDL 88.50 10/21/2013   LDLDIRECT 113.2 08/07/2012   LDLCALC 123* 10/21/2013   ALT 23 10/21/2013   AST 27 10/21/2013   NA 141 10/21/2013   K 4.2 10/21/2013   CL 105 10/21/2013   CREATININE 0.9 10/21/2013   BUN 21 10/21/2013   CO2 27 10/21/2013   TSH 4.08 10/21/2013   HGBA1C 5.9 10/21/2013       Assessment & Plan:   Problem List Items Addressed This Visit    Health care maintenance    Physical today 01/13/15.  PAP 09/2012 - negative with negative HPV.  Colonoscopy 2015 recommended f/u colonoscopy in 2020.  Plan for f/u bone density next year.        History of colonic polyps    Colonoscopy as outlined in overview.  Recommended f/u in 01/2019.        Hyperglycemia    Check metabolic panel and L9R.        Relevant Orders   CBC with Differential/Platelet   Comprehensive metabolic panel   Hemoglobin A1c   Osteopenia    Last bone density improved.  Will plan for f/u bone density next year.  Check vitamin D level.        Relevant Orders   TSH   Vit D  25 hydroxy (rtn osteoporosis monitoring)    Other Visit Diagnoses    Screening breast examination    -  Primary    Relevant Orders    MM DIGITAL SCREENING BILATERAL    Screening cholesterol level        Relevant Orders    Lipid panel        Einar Pheasant, MD

## 2015-01-13 NOTE — Assessment & Plan Note (Signed)
Check metabolic panel and V4F.

## 2015-01-13 NOTE — Assessment & Plan Note (Signed)
Last bone density improved.  Will plan for f/u bone density next year.  Check vitamin D level.

## 2015-01-13 NOTE — Progress Notes (Signed)
Pre visit review using our clinic review tool, if applicable. No additional management support is needed unless otherwise documented below in the visit note. 

## 2015-01-25 LAB — HM MAMMOGRAPHY: HM MAMMO: NEGATIVE

## 2015-01-30 ENCOUNTER — Other Ambulatory Visit: Payer: Self-pay | Admitting: Internal Medicine

## 2015-02-01 NOTE — Telephone Encounter (Signed)
Refilled vagifem #24 with no refills.

## 2015-02-01 NOTE — Telephone Encounter (Signed)
Last OV 8.10.16.  Please advise refill

## 2015-02-05 ENCOUNTER — Encounter: Payer: Self-pay | Admitting: Internal Medicine

## 2015-05-03 ENCOUNTER — Other Ambulatory Visit: Payer: Self-pay | Admitting: Internal Medicine

## 2015-07-26 ENCOUNTER — Other Ambulatory Visit: Payer: Self-pay | Admitting: Internal Medicine

## 2015-07-27 NOTE — Telephone Encounter (Signed)
Okay to refill? Last given 24 tablets in November. Last seen in August

## 2015-07-28 NOTE — Telephone Encounter (Signed)
ok'd refill x 1 (yuvafem)

## 2015-10-18 ENCOUNTER — Other Ambulatory Visit: Payer: Self-pay | Admitting: Internal Medicine

## 2015-10-18 NOTE — Telephone Encounter (Signed)
NO pap since 2014 and no OV since 8/16 ok to fill?

## 2015-10-19 NOTE — Telephone Encounter (Signed)
ok'd refill for vagifem #24 with no refills.

## 2016-01-10 ENCOUNTER — Other Ambulatory Visit: Payer: Self-pay | Admitting: Internal Medicine

## 2016-01-17 ENCOUNTER — Encounter: Payer: Self-pay | Admitting: Internal Medicine

## 2016-01-17 ENCOUNTER — Ambulatory Visit (INDEPENDENT_AMBULATORY_CARE_PROVIDER_SITE_OTHER): Payer: BC Managed Care – PPO | Admitting: Internal Medicine

## 2016-01-17 ENCOUNTER — Other Ambulatory Visit (HOSPITAL_COMMUNITY)
Admission: RE | Admit: 2016-01-17 | Discharge: 2016-01-17 | Disposition: A | Discharge status: Home / Self Care | Payer: BC Managed Care – PPO | Source: Ambulatory Visit | Attending: Internal Medicine | Admitting: Internal Medicine

## 2016-01-17 VITALS — BP 128/70 | HR 62 | Temp 98.2°F | Resp 18 | Ht 63.5 in | Wt 112.2 lb

## 2016-01-17 DIAGNOSIS — Z0000 Encounter for general adult medical examination without abnormal findings: Secondary | ICD-10-CM | POA: Diagnosis not present

## 2016-01-17 DIAGNOSIS — Z01419 Encounter for gynecological examination (general) (routine) without abnormal findings: Secondary | ICD-10-CM | POA: Insufficient documentation

## 2016-01-17 DIAGNOSIS — Z1239 Encounter for other screening for malignant neoplasm of breast: Secondary | ICD-10-CM | POA: Diagnosis not present

## 2016-01-17 DIAGNOSIS — Z1151 Encounter for screening for human papillomavirus (HPV): Secondary | ICD-10-CM | POA: Diagnosis not present

## 2016-01-17 DIAGNOSIS — R739 Hyperglycemia, unspecified: Secondary | ICD-10-CM

## 2016-01-17 DIAGNOSIS — Z124 Encounter for screening for malignant neoplasm of cervix: Secondary | ICD-10-CM

## 2016-01-17 DIAGNOSIS — M8580 Other specified disorders of bone density and structure, unspecified site: Secondary | ICD-10-CM

## 2016-01-17 DIAGNOSIS — Z86010 Personal history of colonic polyps: Secondary | ICD-10-CM

## 2016-01-17 NOTE — Assessment & Plan Note (Signed)
Last bone density improved.  Agreeable to schedule for her bone density now.  Will order.

## 2016-01-17 NOTE — Progress Notes (Signed)
[name], female   DOB: 1956/02/24, 60 y.o.   MRN: [id number]   Subjective:    [name], female    DOB: 06-30-1955, 60 y.o.   MRN: [id number]  HPI  Patient here for her physical exam.  States she is doing well.  Feels good.  No chest pain.  No sob.  Tries to stay active.  No acid reflux reported.  No abdominal pain or cramping.  Bowels stable.     Past Medical History:  Diagnosis Date  . Allergy   . Colon polyps   . Hearing loss, sensorineural 1993  . HSV-1 (herpes simplex virus 1) infection   . Osteopenia   . Postmenopausal    Past Surgical History:  Procedure Laterality Date  . BREAST BIOPSY  1977   Family History  Problem Relation Age of Onset  . Heart disease Father     CHF  . Diabetes Father    Social History   Social History  . Marital status: Married    Spouse name: N/A  . Number of children: N/A  . Years of education: N/A   Social History Main Topics  . Smoking status: Never Smoker  . Smokeless tobacco: Never Used  . Alcohol use 0.0 oz/week  . Drug use: No  . Sexual activity: Not Asked   Other Topics Concern  . None   Social History Narrative  . None    Outpatient Encounter Prescriptions as of 01/17/2016  Medication Sig  . Calcium Citrate (CITRACAL PO) Take 1 tablet by mouth daily.  . IBUPROFEN ADVANCED FORMULA PO Take by mouth as needed.   Marland Kitchen LORATADINE ALLERGY RELIEF PO Take by mouth daily.  Marland Kitchen LYSINE PO Take by mouth daily.  . Omega-3 Fatty Acids (FISH OIL) 600 MG CAPS Take 1 tablet by mouth daily.  Marland Kitchen triamcinolone (NASACORT) 55 MCG/ACT nasal inhaler Place 2 sprays into the nose daily.  Merril Abbe 10 MCG TABS vaginal tablet INSERT 1 TABLET INTO VAGINA TWICE A WEEK AS DIRECTED  . [DISCONTINUED] valACYclovir (VALTREX) 500 MG tablet Take 500 mg by mouth daily.   No facility-administered encounter medications on file as of 01/17/2016.     Review of Systems  Constitutional: Negative for appetite change and unexpected  weight change.  HENT: Negative for congestion and sinus pressure.   Eyes: Negative for pain and visual disturbance.  Respiratory: Negative for cough, chest tightness and shortness of breath.   Cardiovascular: Negative for chest pain, palpitations and leg swelling.  Gastrointestinal: Negative for abdominal pain, diarrhea, nausea and vomiting.  Genitourinary: Negative for difficulty urinating and dysuria.  Musculoskeletal: Negative for back pain and joint swelling.  Skin: Negative for color change and rash.  Neurological: Negative for dizziness, light-headedness and headaches.  Hematological: Negative for adenopathy. Does not bruise/bleed easily.  Psychiatric/Behavioral: Negative for agitation and dysphoric mood.       Objective:    Physical Exam  Constitutional: She is oriented to person, place, and time. She appears well-developed and well-nourished. No distress.  HENT:  Nose: Nose normal.  Mouth/Throat: Oropharynx is clear and moist.  Eyes: Right eye exhibits no discharge. Left eye exhibits no discharge. No scleral icterus.  Neck: Neck supple. No thyromegaly present.  Cardiovascular: Normal rate and regular rhythm.   Pulmonary/Chest: Breath sounds normal. No accessory muscle usage. No tachypnea. No respiratory distress. She has no decreased breath sounds. She has no wheezes. She has no rhonchi. Right breast exhibits no inverted nipple, no mass,  no nipple discharge and no tenderness (no axillary adenopathy). Left breast exhibits no inverted nipple, no mass, no nipple discharge and no tenderness (no axilarry adenopathy).  Abdominal: Soft. Bowel sounds are normal. There is no tenderness.  Genitourinary:  Genitourinary Comments: Normal external genitalia.  Vaginal vault without lesions.  Cervix identified.  Pap smear performed.  Could not appreciate any adnexal masses or tenderness.    Musculoskeletal: She exhibits no edema or tenderness.  Lymphadenopathy:    She has no cervical  adenopathy.  Neurological: She is alert and oriented to person, place, and time.  Skin: Skin is warm. No rash noted. No erythema.  Psychiatric: She has a normal mood and affect. Her behavior is normal.    BP 128/70   Pulse 62   Temp 98.2 F (36.8 C) (Oral)   Resp 18   Ht 5' 3.5" (1.613 m)   Wt 112 lb 4 oz (50.9 kg)   LMP 07/26/2003   SpO2 98%   BMI 19.57 kg/m  Wt Readings from Last 3 Encounters:  01/17/16 112 lb 4 oz (50.9 kg)  01/13/15 110 lb 2 oz (50 kg)  10/02/13 114 lb (51.7 kg)     Lab Results  Component Value Date   WBC 6.4 01/18/2016   HGB 14.1 01/18/2016   HCT 41.8 01/18/2016   PLT 245.0 01/18/2016   GLUCOSE 82 01/18/2016   CHOL 241 (H) 01/18/2016   TRIG 78.0 01/18/2016   HDL 87.40 01/18/2016   LDLDIRECT 113.2 08/07/2012   LDLCALC 138 (H) 01/18/2016   ALT 17 01/18/2016   AST 20 01/18/2016   NA 141 01/18/2016   K 4.0 01/18/2016   CL 104 01/18/2016   CREATININE 0.86 01/18/2016   BUN 16 01/18/2016   CO2 31 01/18/2016   TSH 3.30 01/18/2016   HGBA1C 5.8 01/18/2016       Assessment & Plan:   Problem List Items Addressed This Visit    Health care maintenance    Physical today 01/17/16.  PAP 01/17/16.  Colonoscopy 2015.  Recommended f/u colonoscopy in 2020.  Schedule bone density.       History of colonic polyps   Hyperglycemia    Check met b and a1c        Relevant Orders   CBC with Differential/Platelet (Completed)   Comprehensive metabolic panel (Completed)   TSH (Completed)   Lipid panel (Completed)   Hemoglobin A1c (Completed)   Osteopenia    Last bone density improved.  Agreeable to schedule for her bone density now.  Will order.        Relevant Orders   DG Bone Density   VITAMIN D 25 Hydroxy (Vit-D Deficiency, Fractures) (Completed)    Other Visit Diagnoses    Screening breast examination    -  Primary   Relevant Orders   MM Digital Screening   Pap smear for cervical cancer screening       Relevant Orders   Cytology - PAP        Einar Pheasant, MD

## 2016-01-17 NOTE — Assessment & Plan Note (Signed)
Physical today 01/17/16.  PAP 01/17/16.  Colonoscopy 2015.  Recommended f/u colonoscopy in 2020.  Schedule bone density.

## 2016-01-17 NOTE — Progress Notes (Signed)
Pre-visit discussion using our clinic review tool. No additional management support is needed unless otherwise documented below in the visit note.  

## 2016-01-18 ENCOUNTER — Other Ambulatory Visit (INDEPENDENT_AMBULATORY_CARE_PROVIDER_SITE_OTHER): Payer: BC Managed Care – PPO

## 2016-01-18 DIAGNOSIS — M8580 Other specified disorders of bone density and structure, unspecified site: Secondary | ICD-10-CM

## 2016-01-18 DIAGNOSIS — R739 Hyperglycemia, unspecified: Secondary | ICD-10-CM

## 2016-01-18 LAB — CBC WITH DIFFERENTIAL/PLATELET
BASOS PCT: 0.5 % (ref 0.0–3.0)
Basophils Absolute: 0 10*3/uL (ref 0.0–0.1)
EOS ABS: 0.2 10*3/uL (ref 0.0–0.7)
Eosinophils Relative: 2.8 % (ref 0.0–5.0)
HCT: 41.8 % (ref 36.0–46.0)
HEMOGLOBIN: 14.1 g/dL (ref 12.0–15.0)
LYMPHS PCT: 34.4 % (ref 12.0–46.0)
Lymphs Abs: 2.2 10*3/uL (ref 0.7–4.0)
MCHC: 33.6 g/dL (ref 30.0–36.0)
MCV: 89.5 fl (ref 78.0–100.0)
MONO ABS: 0.4 10*3/uL (ref 0.1–1.0)
Monocytes Relative: 6 % (ref 3.0–12.0)
Neutro Abs: 3.6 10*3/uL (ref 1.4–7.7)
Neutrophils Relative %: 56.3 % (ref 43.0–77.0)
Platelets: 245 10*3/uL (ref 150.0–400.0)
RBC: 4.68 Mil/uL (ref 3.87–5.11)
RDW: 13.6 % (ref 11.5–15.5)
WBC: 6.4 10*3/uL (ref 4.0–10.5)

## 2016-01-18 LAB — COMPREHENSIVE METABOLIC PANEL
ALBUMIN: 4.3 g/dL (ref 3.5–5.2)
ALT: 17 U/L (ref 0–35)
AST: 20 U/L (ref 0–37)
Alkaline Phosphatase: 62 U/L (ref 39–117)
BUN: 16 mg/dL (ref 6–23)
CHLORIDE: 104 meq/L (ref 96–112)
CO2: 31 mEq/L (ref 19–32)
CREATININE: 0.86 mg/dL (ref 0.40–1.20)
Calcium: 9.7 mg/dL (ref 8.4–10.5)
GFR: 71.21 mL/min (ref >60.00)
Glucose, Bld: 82 mg/dL (ref 70–99)
Potassium: 4 mEq/L (ref 3.5–5.1)
SODIUM: 141 meq/L (ref 135–145)
Total Bilirubin: 0.4 mg/dL (ref 0.2–1.2)
Total Protein: 7.1 g/dL (ref 6.0–8.3)

## 2016-01-18 LAB — HEMOGLOBIN A1C: HEMOGLOBIN A1C: 5.8 % (ref 4.6–6.5)

## 2016-01-18 LAB — LIPID PANEL
CHOL/HDL RATIO: 3
Cholesterol: 241 mg/dL — ABNORMAL HIGH (ref 0–200)
HDL: 87.4 mg/dL (ref >39.00)
LDL CALC: 138 mg/dL — AB (ref 0–99)
NONHDL: 153.3
Triglycerides: 78 mg/dL (ref 0.0–149.0)
VLDL: 15.6 mg/dL (ref 0.0–40.0)

## 2016-01-18 LAB — TSH: TSH: 3.3 u[IU]/mL (ref 0.35–4.50)

## 2016-01-18 LAB — VITAMIN D 25 HYDROXY (VIT D DEFICIENCY, FRACTURES): VITD: 38.75 ng/mL (ref 30.00–100.00)

## 2016-01-19 LAB — CYTOLOGY - PAP

## 2016-01-19 NOTE — Assessment & Plan Note (Signed)
Check met b and a1c

## 2016-01-20 ENCOUNTER — Encounter: Payer: Self-pay | Admitting: Internal Medicine

## 2016-01-20 ENCOUNTER — Other Ambulatory Visit: Payer: Self-pay | Admitting: Internal Medicine

## 2016-01-20 DIAGNOSIS — E7800 Pure hypercholesterolemia, unspecified: Secondary | ICD-10-CM

## 2016-01-27 ENCOUNTER — Encounter: Payer: Self-pay | Admitting: Internal Medicine

## 2016-01-27 LAB — HM MAMMOGRAPHY

## 2016-01-27 LAB — HM DEXA SCAN

## 2016-01-28 ENCOUNTER — Encounter: Payer: Self-pay | Admitting: Internal Medicine

## 2016-01-31 ENCOUNTER — Encounter: Payer: Self-pay | Admitting: *Deleted

## 2016-02-22 ENCOUNTER — Telehealth: Payer: Self-pay | Admitting: Internal Medicine

## 2016-02-22 NOTE — Telephone Encounter (Signed)
Pt dropped off a Medical clearance form to be signed by Dr. Nicki Reaper. Paper is up front in colored folder.

## 2016-04-03 ENCOUNTER — Other Ambulatory Visit: Payer: Self-pay | Admitting: Internal Medicine

## 2016-04-03 NOTE — Telephone Encounter (Signed)
Ok to fill? Last filled 01/10/16 24 0rf

## 2016-06-09 ENCOUNTER — Ambulatory Visit (INDEPENDENT_AMBULATORY_CARE_PROVIDER_SITE_OTHER): Payer: BC Managed Care – PPO | Admitting: Family Medicine

## 2016-06-09 ENCOUNTER — Encounter: Payer: Self-pay | Admitting: Family Medicine

## 2016-06-09 DIAGNOSIS — K5900 Constipation, unspecified: Secondary | ICD-10-CM

## 2016-06-09 NOTE — Progress Notes (Signed)
   Subjective:  [name], female    DOB: 1956-01-10  Age: 61 y.o. MRN: [id number]  CC: Constipation  HPI:  61 year old female presents with the above complaint.  Constipation  3 weeks.  She reports that she has had decrease in the frequency of bowel movements as well as in the size of her bowel movements.  She reports that she's having to strain to produce a BM.  She has not changed her diet. She states that she eats healthy with plenty of fruits and vegetables.  She has been using laxatives and has used 2 fleets enemas with the production of small amount of stool.  No weight loss  No hematochezia or melena.  No known inciting factor. No exacerbating factors.  She does report some associated crampy abdominal pain and bloating.  Social Hx   Social History   Social History  . Marital status: Married    Spouse name: N/A  . Number of children: N/A  . Years of education: N/A   Social History Main Topics  . Smoking status: Never Smoker  . Smokeless tobacco: Never Used  . Alcohol use 0.0 oz/week  . Drug use: No  . Sexual activity: Not Asked   Other Topics Concern  . None   Social History Narrative  . None    Review of Systems  Constitutional: Negative.   Gastrointestinal: Positive for constipation.       Abdominal cramping/bloating.   Objective:  BP (!) 151/73   Pulse 75   Temp 98.3 F (36.8 C) (Oral)   Resp 12   Wt 113 lb 6.4 oz (51.4 kg)   LMP 07/26/2003   SpO2 100%   BMI 19.77 kg/m   BP/Weight 06/09/2016 01/17/2016 [id number]  Systolic BP 123XX123 [id number]  Diastolic BP 73 70 76  Wt. (Lbs) 113.4 112.25 110.13  BMI 19.77 19.57 19.06   Physical Exam  Constitutional: She is oriented to person, place, and time. She appears well-developed. No distress.  Pulmonary/Chest: Effort normal.  Abdominal: Soft. She exhibits no distension. There is no tenderness. There is no rebound and no guarding.  Neurological: She is alert and oriented to person,  place, and time.  Psychiatric: She has a normal mood and affect.  Vitals reviewed.  Lab Results  Component Value Date   WBC 6.4 01/18/2016   HGB 14.1 01/18/2016   HCT 41.8 01/18/2016   PLT 245.0 01/18/2016   GLUCOSE 82 01/18/2016   CHOL 241 (H) 01/18/2016   TRIG 78.0 01/18/2016   HDL 87.40 01/18/2016   LDLDIRECT 113.2 08/07/2012   LDLCALC 138 (H) 01/18/2016   ALT 17 01/18/2016   AST 20 01/18/2016   NA 141 01/18/2016   K 4.0 01/18/2016   CL 104 01/18/2016   CREATININE 0.86 01/18/2016   BUN 16 01/18/2016   CO2 31 01/18/2016   TSH 3.30 01/18/2016   HGBA1C 5.8 01/18/2016    Assessment & Plan:   Problem List Items Addressed This Visit    Constipation    New acute problem. No red flags. Last colonoscopy was 2 years ago. Treating with colace and miralax. If worsens or fails to improve, will need to see GI for potential colonoscopy.        Follow-up: PRN  [employer]

## 2016-06-09 NOTE — Assessment & Plan Note (Signed)
New acute problem. No red flags. Last colonoscopy was 2 years ago. Treating with colace and miralax. If worsens or fails to improve, will need to see GI for potential colonoscopy.

## 2016-06-09 NOTE — Progress Notes (Signed)
Pre visit review using our clinic review tool, if applicable. No additional management support is needed unless otherwise documented below in the visit note. 

## 2016-06-09 NOTE — Patient Instructions (Signed)
Colace - 100 mg twice daily.  Miralax - 17 g twice daily. Increase in increments of 17 g until you have a regular soft BM.  If this fails to improve please let me know  Take care  Dr. Lacinda Axon

## 2016-07-03 ENCOUNTER — Other Ambulatory Visit: Payer: Self-pay | Admitting: Internal Medicine

## 2016-07-03 NOTE — Telephone Encounter (Signed)
Last filled 04/03/16 24 0rf last OV 01/17/16

## 2016-07-17 ENCOUNTER — Other Ambulatory Visit: Payer: BC Managed Care – PPO

## 2016-07-19 ENCOUNTER — Other Ambulatory Visit (INDEPENDENT_AMBULATORY_CARE_PROVIDER_SITE_OTHER): Payer: BC Managed Care – PPO

## 2016-07-19 DIAGNOSIS — E7800 Pure hypercholesterolemia, unspecified: Secondary | ICD-10-CM

## 2016-07-19 LAB — LIPID PANEL
CHOL/HDL RATIO: 2
CHOLESTEROL: 237 mg/dL — AB (ref 0–200)
HDL: 95.3 mg/dL (ref >39.00)
LDL CALC: 128 mg/dL — AB (ref 0–99)
NonHDL: 141.7
TRIGLYCERIDES: 70 mg/dL (ref 0.0–149.0)
VLDL: 14 mg/dL (ref 0.0–40.0)

## 2016-07-20 ENCOUNTER — Encounter: Payer: Self-pay | Admitting: Internal Medicine

## 2016-09-23 ENCOUNTER — Other Ambulatory Visit: Payer: Self-pay | Admitting: Internal Medicine

## 2016-12-17 ENCOUNTER — Other Ambulatory Visit: Payer: Self-pay | Admitting: Internal Medicine

## 2017-01-18 ENCOUNTER — Ambulatory Visit (INDEPENDENT_AMBULATORY_CARE_PROVIDER_SITE_OTHER): Payer: BC Managed Care – PPO | Admitting: Internal Medicine

## 2017-01-18 ENCOUNTER — Encounter: Payer: Self-pay | Admitting: Internal Medicine

## 2017-01-18 VITALS — BP 142/72 | HR 69 | Temp 98.7°F | Resp 12 | Ht 64.0 in | Wt 112.2 lb

## 2017-01-18 DIAGNOSIS — K5900 Constipation, unspecified: Secondary | ICD-10-CM | POA: Diagnosis not present

## 2017-01-18 DIAGNOSIS — Z0000 Encounter for general adult medical examination without abnormal findings: Secondary | ICD-10-CM | POA: Diagnosis not present

## 2017-01-18 DIAGNOSIS — M8580 Other specified disorders of bone density and structure, unspecified site: Secondary | ICD-10-CM

## 2017-01-18 DIAGNOSIS — Z86010 Personal history of colonic polyps: Secondary | ICD-10-CM

## 2017-01-18 DIAGNOSIS — L989 Disorder of the skin and subcutaneous tissue, unspecified: Secondary | ICD-10-CM | POA: Diagnosis not present

## 2017-01-18 DIAGNOSIS — R739 Hyperglycemia, unspecified: Secondary | ICD-10-CM

## 2017-01-18 MED ORDER — ZOSTER VAC RECOMB ADJUVANTED 50 MCG/0.5ML IM SUSR: 0.5000 mL | Freq: Once | INTRAMUSCULAR | 0 refills | Status: AC

## 2017-01-18 NOTE — Progress Notes (Signed)
[name], female   DOB: [birth year], 61 y.o.   MRN: [id number]   Subjective:    [name], female    DOB: Oct 11, 1955, 61 y.o.   MRN: [id number]  HPI  Patient here for her physical exam.  She had some problems with constipation previously.  Started coconut oil.  Is exercising, yoga and walking.  She is eating oat bran.  Bowels are moving better.  No chest pain.  No sob.  No acid reflux.  No abdominal pain.     Past Medical History:  Diagnosis Date  . Allergy   . Colon polyps   . Hearing loss, sensorineural 1993  . HSV-1 (herpes simplex virus 1) infection   . Osteopenia   . Postmenopausal    Past Surgical History:  Procedure Laterality Date  . BREAST BIOPSY  1977   Family History  Problem Relation Age of Onset  . Heart disease Father        CHF  . Diabetes Father    Social History   Social History  . Marital status: Married    Spouse name: N/A  . Number of children: N/A  . Years of education: N/A   Social History Main Topics  . Smoking status: Never Smoker  . Smokeless tobacco: Never Used  . Alcohol use 0.0 oz/week  . Drug use: No  . Sexual activity: Not Asked   Other Topics Concern  . None   Social History Narrative  . None    Outpatient Encounter Prescriptions as of 01/18/2017  Medication Sig  . Calcium Citrate (CITRACAL PO) Take 1 tablet by mouth daily.  . IBUPROFEN ADVANCED FORMULA PO Take by mouth as needed.   . Omega-3 Fatty Acids (FISH OIL) 600 MG CAPS Take 1 tablet by mouth daily.  Marland Kitchen triamcinolone (NASACORT) 55 MCG/ACT nasal inhaler Place 2 sprays into the nose daily.  . [DISCONTINUED] LORATADINE ALLERGY RELIEF PO Take by mouth daily.  . [DISCONTINUED] YUVAFEM 10 MCG TABS vaginal tablet INSERT 1 TABLET INTO VAGINA TWICE A WEEK AS DIRECTED  . [EXPIRED] Zoster Vac Recomb Adjuvanted [facility]) injection Inject 0.5 mLs into the muscle once.  . [DISCONTINUED] LYSINE PO Take by mouth daily.   No facility-administered encounter  medications on file as of 01/18/2017.     Review of Systems  Constitutional: Negative for appetite change and unexpected weight change.  HENT: Negative for congestion and sinus pressure.   Eyes: Negative for pain and visual disturbance.  Respiratory: Negative for cough, chest tightness and shortness of breath.   Cardiovascular: Negative for chest pain, palpitations and leg swelling.  Gastrointestinal: Negative for abdominal pain, diarrhea, nausea and vomiting.  Genitourinary: Negative for difficulty urinating and dysuria.  Musculoskeletal: Negative for back pain and joint swelling.  Skin: Negative for color change and rash.  Neurological: Negative for dizziness, light-headedness and headaches.  Hematological: Negative for adenopathy. Does not bruise/bleed easily.  Psychiatric/Behavioral: Negative for agitation and dysphoric mood.       Objective:    Physical Exam  Constitutional: She is oriented to person, place, and time. She appears well-developed and well-nourished. No distress.  HENT:  Nose: Nose normal.  Mouth/Throat: Oropharynx is clear and moist.  Eyes: Right eye exhibits no discharge. Left eye exhibits no discharge. No scleral icterus.  Neck: Neck supple. No thyromegaly present.  Cardiovascular: Normal rate and regular rhythm.   Pulmonary/Chest: Breath sounds normal. No accessory muscle usage. No tachypnea. No respiratory distress. She has no decreased breath  sounds. She has no wheezes. She has no rhonchi. Right breast exhibits no inverted nipple, no mass, no nipple discharge and no tenderness (no axillary adenopathy). Left breast exhibits no inverted nipple, no mass, no nipple discharge and no tenderness (no axilarry adenopathy).  Abdominal: Soft. Bowel sounds are normal. There is no tenderness.  Musculoskeletal: She exhibits no edema or tenderness.  Lymphadenopathy:    She has no cervical adenopathy.  Neurological: She is alert and oriented to person, place, and time.    Skin: Skin is warm. No rash noted. No erythema.  Psychiatric: She has a normal mood and affect. Her behavior is normal.    BP (!) 142/72 (BP Location: Left Arm, Patient Position: Sitting, Cuff Size: Normal)   Pulse 69   Temp 98.7 F (37.1 C) (Oral)   Resp 12   Ht _0  (1.626 m)   Wt 112 lb 3.2 oz (50.9 kg)   LMP 07/26/2003   SpO2 99%   BMI 19.26 kg/m  Wt Readings from Last 3 Encounters:  01/18/17 112 lb 3.2 oz (50.9 kg)  06/09/16 113 lb 6.4 oz (51.4 kg)  01/17/16 112 lb 4 oz (50.9 kg)     Lab Results  Component Value Date   WBC 6.4 01/18/2016   HGB 14.1 01/18/2016   HCT 41.8 01/18/2016   PLT 245.0 01/18/2016   GLUCOSE 82 01/18/2016   CHOL 237 (H) 07/19/2016   TRIG 70.0 07/19/2016   HDL 95.30 07/19/2016   LDLDIRECT 113.2 08/07/2012   LDLCALC 128 (H) 07/19/2016   ALT 17 01/18/2016   AST 20 01/18/2016   NA 141 01/18/2016   K 4.0 01/18/2016   CL 104 01/18/2016   CREATININE 0.86 01/18/2016   BUN 16 01/18/2016   CO2 31 01/18/2016   TSH 3.30 01/18/2016   HGBA1C 5.8 01/18/2016       Assessment & Plan:   Problem List Items Addressed This Visit    Constipation    Improved with exercise, etc as outlined.  Follow.       Health care maintenance    Physical today 01/18/17.  PAP 01/17/16 negative with negative HPV.  Mammogram 01/27/16 - Birads I.  She will schedule f/u mammogram.  Colonoscopy 2015.  Recommended f/u in 2020.        History of colonic polyps    Colonoscopy 01/07/14 - tubular adenoma.  Recommended f/u colonoscopy in 01/2019.        Hyperglycemia    Low carb diet and exercise.  Follow met b and a1c.       Relevant Orders   Hemoglobin A1c   Hepatic function panel   Lipid panel   TSH   CBC with Differential/Platelet   Basic metabolic panel   Osteopenia   Relevant Orders   VITAMIN D 25 Hydroxy (Vit-D Deficiency, Fractures)    Other Visit Diagnoses    Routine general medical examination at a health care facility    -  Primary   Skin lesion of  right arm       discussed referral to dermatology.  she wants to watch for now.  follow.        Einar Pheasant, MD

## 2017-01-18 NOTE — Assessment & Plan Note (Signed)
Physical today 01/18/17.  PAP 01/17/16 negative with negative HPV.  Mammogram 01/27/16 - Birads I.  She will schedule f/u mammogram.  Colonoscopy 2015.  Recommended f/u in 2020.

## 2017-01-20 ENCOUNTER — Encounter: Payer: Self-pay | Admitting: Internal Medicine

## 2017-01-20 NOTE — Assessment & Plan Note (Signed)
Colonoscopy 01/07/14 - tubular adenoma.  Recommended f/u colonoscopy in 01/2019.

## 2017-01-20 NOTE — Assessment & Plan Note (Signed)
Low carb diet and exercise.  Follow met b and a1c.

## 2017-01-20 NOTE — Assessment & Plan Note (Signed)
Improved with exercise, etc as outlined.  Follow.

## 2017-02-01 ENCOUNTER — Other Ambulatory Visit (INDEPENDENT_AMBULATORY_CARE_PROVIDER_SITE_OTHER): Payer: BC Managed Care – PPO

## 2017-02-01 DIAGNOSIS — R739 Hyperglycemia, unspecified: Secondary | ICD-10-CM

## 2017-02-01 DIAGNOSIS — M8580 Other specified disorders of bone density and structure, unspecified site: Secondary | ICD-10-CM

## 2017-02-01 LAB — HEPATIC FUNCTION PANEL
ALBUMIN: 4.2 g/dL (ref 3.5–5.2)
ALT: 20 U/L (ref 0–35)
AST: 22 U/L (ref 0–37)
Alkaline Phosphatase: 63 U/L (ref 39–117)
Bilirubin, Direct: 0 mg/dL (ref 0.0–0.3)
TOTAL PROTEIN: 6.5 g/dL (ref 6.0–8.3)
Total Bilirubin: 0.3 mg/dL (ref 0.2–1.2)

## 2017-02-01 LAB — CBC WITH DIFFERENTIAL/PLATELET
BASOS ABS: 0 10*3/uL (ref 0.0–0.1)
Basophils Relative: 0.7 % (ref 0.0–3.0)
EOS PCT: 3.6 % (ref 0.0–5.0)
Eosinophils Absolute: 0.2 10*3/uL (ref 0.0–0.7)
HCT: 40.5 % (ref 36.0–46.0)
HEMOGLOBIN: 13.4 g/dL (ref 12.0–15.0)
Lymphocytes Relative: 38.4 % (ref 12.0–46.0)
Lymphs Abs: 2.1 10*3/uL (ref 0.7–4.0)
MCHC: 33.1 g/dL (ref 30.0–36.0)
MCV: 92.1 fl (ref 78.0–100.0)
MONOS PCT: 7.4 % (ref 3.0–12.0)
Monocytes Absolute: 0.4 10*3/uL (ref 0.1–1.0)
Neutro Abs: 2.7 10*3/uL (ref 1.4–7.7)
Neutrophils Relative %: 49.9 % (ref 43.0–77.0)
Platelets: 243 10*3/uL (ref 150.0–400.0)
RBC: 4.4 Mil/uL (ref 3.87–5.11)
RDW: 13.7 % (ref 11.5–15.5)
WBC: 5.4 10*3/uL (ref 4.0–10.5)

## 2017-02-01 LAB — LIPID PANEL
CHOLESTEROL: 229 mg/dL — AB (ref 0–200)
HDL: 83.5 mg/dL (ref >39.00)
LDL Cholesterol: 135 mg/dL — ABNORMAL HIGH (ref 0–99)
NonHDL: 145.37
Total CHOL/HDL Ratio: 3
Triglycerides: 54 mg/dL (ref 0.0–149.0)
VLDL: 10.8 mg/dL (ref 0.0–40.0)

## 2017-02-01 LAB — BASIC METABOLIC PANEL
BUN: 17 mg/dL (ref 6–23)
CALCIUM: 9.7 mg/dL (ref 8.4–10.5)
CHLORIDE: 102 meq/L (ref 96–112)
CO2: 32 meq/L (ref 19–32)
CREATININE: 0.89 mg/dL (ref 0.40–1.20)
GFR: 68.21 mL/min (ref >60.00)
GLUCOSE: 88 mg/dL (ref 70–99)
Potassium: 4.5 mEq/L (ref 3.5–5.1)
Sodium: 141 mEq/L (ref 135–145)

## 2017-02-01 LAB — TSH: TSH: 3.26 u[IU]/mL (ref 0.35–4.50)

## 2017-02-01 LAB — VITAMIN D 25 HYDROXY (VIT D DEFICIENCY, FRACTURES): VITD: 58.46 ng/mL (ref 30.00–100.00)

## 2017-02-01 LAB — HEMOGLOBIN A1C: Hgb A1c MFr Bld: 5.8 % (ref 4.6–6.5)

## 2017-02-02 ENCOUNTER — Encounter: Payer: Self-pay | Admitting: Internal Medicine

## 2017-03-10 ENCOUNTER — Other Ambulatory Visit: Payer: Self-pay | Admitting: Internal Medicine

## 2017-05-02 ENCOUNTER — Encounter: Payer: Self-pay | Admitting: Internal Medicine

## 2017-05-02 ENCOUNTER — Ambulatory Visit: Payer: BC Managed Care – PPO | Admitting: Internal Medicine

## 2017-05-02 VITALS — BP 150/80 | HR 69 | Temp 98.3°F | Ht 64.0 in | Wt 111.6 lb

## 2017-05-02 DIAGNOSIS — Z1159 Encounter for screening for other viral diseases: Secondary | ICD-10-CM

## 2017-05-02 DIAGNOSIS — R739 Hyperglycemia, unspecified: Secondary | ICD-10-CM

## 2017-05-02 DIAGNOSIS — I10 Essential (primary) hypertension: Secondary | ICD-10-CM | POA: Diagnosis not present

## 2017-05-02 MED ORDER — LISINOPRIL 10 MG PO TABS: 10.0000 mg | ORAL_TABLET | Freq: Every day | ORAL | 1 refills | Status: DC

## 2017-05-02 NOTE — Progress Notes (Signed)
[name], female   DOB: 08/11/1955, 61 y.o.   MRN: [id number]   Subjective:    [name], female    DOB: 08-05-1955, 61 y.o.   MRN: [id number]  HPI  Patient here for a scheduled follow up.  Here to f/u on her blood pressure.  States she is doing well.  Exercising.  Doing yoga.  No chest pain.  No sob.  Eating well.  No nausea or vomiting.  Bowels moving.  Brings in blood pressure readings.  Blood pressures averaging 130-140s/60-70s.    Past Medical History:  Diagnosis Date  . Allergy   . Colon polyps   . Hearing loss, sensorineural 1993  . HSV-1 (herpes simplex virus 1) infection   . Osteopenia   . Postmenopausal    Past Surgical History:  Procedure Laterality Date  . BREAST BIOPSY  1977   Family History  Problem Relation Age of Onset  . Heart disease Father        CHF  . Diabetes Father    Social History   Socioeconomic History  . Marital status: Married    Spouse name: None  . Number of children: None  . Years of education: None  . Highest education level: None  Social Needs  . Financial resource strain: None  . Food insecurity - worry: None  . Food insecurity - inability: None  . Transportation needs - medical: None  . Transportation needs - non-medical: None  Occupational History  . None  Tobacco Use  . Smoking status: Never Smoker  . Smokeless tobacco: Never Used  Substance and Sexual Activity  . Alcohol use: Yes    Alcohol/week: 0.0 oz  . Drug use: No  . Sexual activity: None  Other Topics Concern  . None  Social History Narrative  . None    Outpatient Encounter Medications as of 05/02/2017  Medication Sig  . AFLURIA QUADRIVALENT 0.5 ML injection TO BE ADMINISTERED BY PHARMACIST FOR IMMUNIZATION  . Calcium Citrate (CITRACAL PO) Take 1 tablet by mouth daily.  . IBUPROFEN ADVANCED FORMULA PO Take by mouth as needed.   . Omega-3 Fatty Acids (FISH OIL) 600 MG CAPS Take 1 tablet by mouth daily.  Marland Kitchen triamcinolone  (NASACORT) 55 MCG/ACT nasal inhaler Place 2 sprays into the nose daily.  Merril Abbe 10 MCG TABS vaginal tablet INSERT 1 TABLET INTO VAGINA TWICE A WEEK AS DIRECTED  . lisinopril (PRINIVIL,ZESTRIL) 10 MG tablet Take 1 tablet (10 mg total) by mouth daily.   No facility-administered encounter medications on file as of 05/02/2017.     Review of Systems  Constitutional: Negative for appetite change and unexpected weight change.  HENT: Negative for congestion and sinus pressure.   Respiratory: Negative for cough, chest tightness and shortness of breath.   Cardiovascular: Negative for chest pain, palpitations and leg swelling.  Gastrointestinal: Negative for abdominal pain, diarrhea, nausea and vomiting.  Genitourinary: Negative for difficulty urinating and dysuria.  Musculoskeletal: Negative for back pain and joint swelling.  Skin: Negative for color change and rash.  Neurological: Negative for dizziness, light-headedness and headaches.  Psychiatric/Behavioral: Negative for agitation and dysphoric mood.       Objective:     Blood pressure rechecked by me:  148/78  Physical Exam  Constitutional: She appears well-developed and well-nourished. No distress.  HENT:  Nose: Nose normal.  Mouth/Throat: Oropharynx is clear and moist.  Neck: Neck supple. No thyromegaly present.  Cardiovascular: Normal rate and regular rhythm.  Pulmonary/Chest: Breath sounds normal. No respiratory distress. She has no wheezes.  Abdominal: Soft. Bowel sounds are normal. There is no tenderness.  Musculoskeletal: She exhibits no edema or tenderness.  Lymphadenopathy:    She has no cervical adenopathy.  Skin: No rash noted. No erythema.  Psychiatric: She has a normal mood and affect. Her behavior is normal.    BP (!) 150/80   Pulse 69   Temp 98.3 F (36.8 C) (Oral)   Ht _0  (1.626 m)   Wt 111 lb 9.6 oz (50.6 kg)   LMP 07/26/2003   SpO2 97%   BMI 19.16 kg/m  Wt Readings from Last 3 Encounters:    05/02/17 111 lb 9.6 oz (50.6 kg)  01/18/17 112 lb 3.2 oz (50.9 kg)  06/09/16 113 lb 6.4 oz (51.4 kg)     Lab Results  Component Value Date   WBC 5.4 02/01/2017   HGB 13.4 02/01/2017   HCT 40.5 02/01/2017   PLT 243.0 02/01/2017   GLUCOSE 88 02/01/2017   CHOL 229 (H) 02/01/2017   TRIG 54.0 02/01/2017   HDL 83.50 02/01/2017   LDLDIRECT 113.2 08/07/2012   LDLCALC 135 (H) 02/01/2017   ALT 20 02/01/2017   AST 22 02/01/2017   NA 141 02/01/2017   K 4.5 02/01/2017   CL 102 02/01/2017   CREATININE 0.89 02/01/2017   BUN 17 02/01/2017   CO2 32 02/01/2017   TSH 3.26 02/01/2017   HGBA1C 5.8 02/01/2017       Assessment & Plan:   Problem List Items Addressed This Visit    Hyperglycemia    Follow met b and a1c.        Relevant Orders   Hemoglobin A1c   Hypertension, essential    Blood pressure as outlined.  Will start lisinopril 16m q day.  Follow pressures.  Check metabolic panel in 121-03days.        Relevant Medications   lisinopril (PRINIVIL,ZESTRIL) 10 MG tablet   Other Relevant Orders   Lipid panel   Hepatic function panel   Basic metabolic panel    Other Visit Diagnoses    Need for hepatitis C screening test    -  Primary   Relevant Orders   Hepatitis C antibody       SEinar Pheasant MD

## 2017-05-02 NOTE — Progress Notes (Signed)
Pre visit review using our clinic review tool, if applicable. No additional management support is needed unless otherwise documented below in the visit note. 

## 2017-05-05 ENCOUNTER — Encounter: Payer: Self-pay | Admitting: Internal Medicine

## 2017-05-05 DIAGNOSIS — I10 Essential (primary) hypertension: Secondary | ICD-10-CM | POA: Insufficient documentation

## 2017-05-05 NOTE — Assessment & Plan Note (Signed)
Follow met b and a1c.

## 2017-05-05 NOTE — Assessment & Plan Note (Signed)
Blood pressure as outlined.  Will start lisinopril 10mg  q day.  Follow pressures.  Check metabolic panel in 64-38 days.

## 2017-05-15 ENCOUNTER — Other Ambulatory Visit: Payer: BC Managed Care – PPO

## 2017-05-22 ENCOUNTER — Other Ambulatory Visit (INDEPENDENT_AMBULATORY_CARE_PROVIDER_SITE_OTHER): Payer: BC Managed Care – PPO

## 2017-05-22 DIAGNOSIS — I10 Essential (primary) hypertension: Secondary | ICD-10-CM | POA: Diagnosis not present

## 2017-05-22 DIAGNOSIS — R739 Hyperglycemia, unspecified: Secondary | ICD-10-CM

## 2017-05-22 DIAGNOSIS — Z1159 Encounter for screening for other viral diseases: Secondary | ICD-10-CM

## 2017-05-22 LAB — LIPID PANEL
CHOLESTEROL: 201 mg/dL — AB (ref 0–200)
HDL: 85.2 mg/dL (ref >39.00)
LDL Cholesterol: 104 mg/dL — ABNORMAL HIGH (ref 0–99)
NonHDL: 115.67
Total CHOL/HDL Ratio: 2
Triglycerides: 56 mg/dL (ref 0.0–149.0)
VLDL: 11.2 mg/dL (ref 0.0–40.0)

## 2017-05-22 LAB — HEPATIC FUNCTION PANEL
ALBUMIN: 4 g/dL (ref 3.5–5.2)
ALT: 20 U/L (ref 0–35)
AST: 23 U/L (ref 0–37)
Alkaline Phosphatase: 56 U/L (ref 39–117)
Bilirubin, Direct: 0 mg/dL (ref 0.0–0.3)
Total Bilirubin: 0.4 mg/dL (ref 0.2–1.2)
Total Protein: 6.6 g/dL (ref 6.0–8.3)

## 2017-05-22 LAB — BASIC METABOLIC PANEL
BUN: 17 mg/dL (ref 6–23)
CALCIUM: 9.1 mg/dL (ref 8.4–10.5)
CHLORIDE: 102 meq/L (ref 96–112)
CO2: 30 mEq/L (ref 19–32)
Creatinine, Ser: 0.83 mg/dL (ref 0.40–1.20)
GFR: 73.86 mL/min (ref >60.00)
Glucose, Bld: 81 mg/dL (ref 70–99)
POTASSIUM: 3.9 meq/L (ref 3.5–5.1)
SODIUM: 138 meq/L (ref 135–145)

## 2017-05-22 LAB — HEMOGLOBIN A1C: Hgb A1c MFr Bld: 5.9 % (ref 4.6–6.5)

## 2017-05-23 ENCOUNTER — Encounter: Payer: Self-pay | Admitting: Internal Medicine

## 2017-05-23 LAB — HEPATITIS C ANTIBODY
Hepatitis C Ab: NONREACTIVE
SIGNAL TO CUT-OFF: 0.01 (ref <1.00)

## 2017-06-20 ENCOUNTER — Other Ambulatory Visit: Payer: Self-pay | Admitting: Internal Medicine

## 2017-06-21 ENCOUNTER — Other Ambulatory Visit: Payer: Self-pay | Admitting: *Deleted

## 2017-06-21 MED ORDER — LISINOPRIL 10 MG PO TABS: 10.0000 mg | ORAL_TABLET | Freq: Every day | ORAL | 1 refills | Status: DC

## 2017-07-02 ENCOUNTER — Encounter: Payer: Self-pay | Admitting: Internal Medicine

## 2017-07-02 ENCOUNTER — Ambulatory Visit: Payer: BC Managed Care – PPO | Admitting: Internal Medicine

## 2017-07-02 DIAGNOSIS — R739 Hyperglycemia, unspecified: Secondary | ICD-10-CM | POA: Diagnosis not present

## 2017-07-02 DIAGNOSIS — I10 Essential (primary) hypertension: Secondary | ICD-10-CM

## 2017-07-02 NOTE — Progress Notes (Signed)
[name], female   DOB: August 25, 1955, 62 y.o.   MRN: [id number]   Subjective:    [name], female    DOB: Mar 12, 1956, 62 y.o.   MRN: [id number]  HPI  Patient here for a scheduled follow up.  States she is doing well.  Stays active.  Exercises.  No chest pain.  No sob.  No acid reflux.  No abdominal pain.  Bowels moving.  No urine change.  Started her on lisinopril last visit.  Tolerating without adverse effects.  Blood pressures on outside checks averaging 120-130s/60-70s.  Overall feels good.     Past Medical History:  Diagnosis Date  . Allergy   . Colon polyps   . Hearing loss, sensorineural 1993  . HSV-1 (herpes simplex virus 1) infection   . Osteopenia   . Postmenopausal    Past Surgical History:  Procedure Laterality Date  . BREAST BIOPSY  1977   Family History  Problem Relation Age of Onset  . Heart disease Father        CHF  . Diabetes Father    Social History   Socioeconomic History  . Marital status: Married    Spouse name: None  . Number of children: None  . Years of education: None  . Highest education level: None  Social Needs  . Financial resource strain: None  . Food insecurity - worry: None  . Food insecurity - inability: None  . Transportation needs - medical: None  . Transportation needs - non-medical: None  Occupational History  . None  Tobacco Use  . Smoking status: Never Smoker  . Smokeless tobacco: Never Used  Substance and Sexual Activity  . Alcohol use: Yes    Alcohol/week: 0.0 oz  . Drug use: No  . Sexual activity: None  Other Topics Concern  . None  Social History Narrative  . None    Outpatient Encounter Medications as of 07/02/2017  Medication Sig  . AFLURIA QUADRIVALENT 0.5 ML injection TO BE ADMINISTERED BY PHARMACIST FOR IMMUNIZATION  . Calcium Citrate (CITRACAL PO) Take 1 tablet by mouth daily.  . IBUPROFEN ADVANCED FORMULA PO Take by mouth as needed.   Marland Kitchen lisinopril (PRINIVIL,ZESTRIL) 10 MG  tablet Take 1 tablet (10 mg total) by mouth daily.  . Omega-3 Fatty Acids (FISH OIL) 600 MG CAPS Take 1 tablet by mouth daily.  Marland Kitchen triamcinolone (NASACORT) 55 MCG/ACT nasal inhaler Place 2 sprays into the nose daily.  Merril Abbe 10 MCG TABS vaginal tablet INSERT 1 TABLET INTO VAGINA TWICE A WEEK AS DIRECTED   No facility-administered encounter medications on file as of 07/02/2017.     Review of Systems  Constitutional: Negative for appetite change and unexpected weight change.  HENT: Negative for congestion and sinus pressure.   Respiratory: Negative for cough, chest tightness and shortness of breath.   Cardiovascular: Negative for chest pain, palpitations and leg swelling.  Gastrointestinal: Negative for abdominal pain, diarrhea, nausea and vomiting.  Genitourinary: Negative for difficulty urinating and dysuria.  Musculoskeletal: Negative for joint swelling and myalgias.  Skin: Negative for color change and rash.  Neurological: Negative for dizziness, light-headedness and headaches.  Psychiatric/Behavioral: Negative for agitation and dysphoric mood.       Objective:    Physical Exam  Constitutional: She appears well-developed and well-nourished. No distress.  HENT:  Nose: Nose normal.  Mouth/Throat: Oropharynx is clear and moist.  Neck: Neck supple. No thyromegaly present.  Cardiovascular: Normal rate and regular rhythm.  Pulmonary/Chest: Breath sounds normal. No respiratory distress. She has no wheezes.  Abdominal: Soft. Bowel sounds are normal. There is no tenderness.  Musculoskeletal: She exhibits no edema or tenderness.  Lymphadenopathy:    She has no cervical adenopathy.  Skin: No rash noted. No erythema.  Psychiatric: She has a normal mood and affect. Her behavior is normal.    BP 132/68   Pulse 75   Temp 98.6 F (37 C) (Oral)   Ht '5\' 4"'  (1.626 m)   Wt 113 lb 9.6 oz (51.5 kg)   LMP 07/26/2003   SpO2 97%   BMI 19.50 kg/m  Wt Readings from Last 3 Encounters:    07/02/17 113 lb 9.6 oz (51.5 kg)  05/02/17 111 lb 9.6 oz (50.6 kg)  01/18/17 112 lb 3.2 oz (50.9 kg)     Lab Results  Component Value Date   WBC 5.4 02/01/2017   HGB 13.4 02/01/2017   HCT 40.5 02/01/2017   PLT 243.0 02/01/2017   GLUCOSE 81 05/22/2017   CHOL 201 (H) 05/22/2017   TRIG 56.0 05/22/2017   HDL 85.20 05/22/2017   LDLDIRECT 113.2 08/07/2012   LDLCALC 104 (H) 05/22/2017   ALT 20 05/22/2017   AST 23 05/22/2017   NA 138 05/22/2017   K 3.9 05/22/2017   CL 102 05/22/2017   CREATININE 0.83 05/22/2017   BUN 17 05/22/2017   CO2 30 05/22/2017   TSH 3.26 02/01/2017   HGBA1C 5.9 05/22/2017       Assessment & Plan:   Problem List Items Addressed This Visit    Hyperglycemia    Follow met b and a1c.       Hypertension, essential    Blood pressure on outside checks as outlined.  Doing better.  Continue lisinopril.  Follow pressures.  Follow met b.            Einar Pheasant, MD

## 2017-07-02 NOTE — Progress Notes (Signed)
Pre visit review using our clinic review tool, if applicable. No additional management support is needed unless otherwise documented below in the visit note. 

## 2017-07-05 ENCOUNTER — Encounter: Payer: Self-pay | Admitting: Internal Medicine

## 2017-07-05 NOTE — Assessment & Plan Note (Signed)
Follow met b and a1c.

## 2017-07-05 NOTE — Assessment & Plan Note (Signed)
Blood pressure on outside checks as outlined.  Doing better.  Continue lisinopril.  Follow pressures.  Follow met b.

## 2017-10-30 ENCOUNTER — Encounter: Payer: Self-pay | Admitting: Internal Medicine

## 2017-10-30 ENCOUNTER — Ambulatory Visit: Payer: BC Managed Care – PPO | Admitting: Internal Medicine

## 2017-10-30 VITALS — BP 122/64 | HR 66 | Temp 98.2°F | Resp 16 | Wt 111.4 lb

## 2017-10-30 DIAGNOSIS — M25472 Effusion, left ankle: Secondary | ICD-10-CM

## 2017-10-30 DIAGNOSIS — M8580 Other specified disorders of bone density and structure, unspecified site: Secondary | ICD-10-CM | POA: Diagnosis not present

## 2017-10-30 DIAGNOSIS — R739 Hyperglycemia, unspecified: Secondary | ICD-10-CM | POA: Diagnosis not present

## 2017-10-30 DIAGNOSIS — I10 Essential (primary) hypertension: Secondary | ICD-10-CM | POA: Diagnosis not present

## 2017-10-30 LAB — BASIC METABOLIC PANEL
BUN: 22 mg/dL (ref 6–23)
CALCIUM: 9.7 mg/dL (ref 8.4–10.5)
CO2: 32 mEq/L (ref 19–32)
CREATININE: 0.82 mg/dL (ref 0.40–1.20)
Chloride: 101 mEq/L (ref 96–112)
GFR: 74.79 mL/min (ref >60.00)
Glucose, Bld: 93 mg/dL (ref 70–99)
Potassium: 4.3 mEq/L (ref 3.5–5.1)
SODIUM: 140 meq/L (ref 135–145)

## 2017-10-30 NOTE — Progress Notes (Signed)
[name], female   DOB: 12/14/1955, 62 y.o.   MRN: [id number]   Subjective:    [name], female    DOB: 06/09/1955, 62 y.o.   MRN: [id number]  HPI  Patient here for a scheduled follow up.  Here to f/u on her blood pressure.  On lisinopril and doing well.  Feels good.  Stays active.  Is exercising.  Doing yoga.  No chest pain.  No sob.  No acid reflux.  No abdominal pain.  Bowels moving.  No urine change.  Will schedule f/u bone density at her physical.  She will schedule her mammogram.  Due in 02/2018.  Has noticed some minimal left ankle swelling at times.  No injury.  No swelling today.  Worse in evening.     Past Medical History:  Diagnosis Date  . Allergy   . Colon polyps   . Hearing loss, sensorineural 1993  . HSV-1 (herpes simplex virus 1) infection   . Osteopenia   . Postmenopausal    Past Surgical History:  Procedure Laterality Date  . BREAST BIOPSY  1977   Family History  Problem Relation Age of Onset  . Heart disease Father        CHF  . Diabetes Father    Social History   Socioeconomic History  . Marital status: Married    Spouse name: Not on file  . Number of children: Not on file  . Years of education: Not on file  . Highest education level: Not on file  Occupational History  . Not on file  Social Needs  . Financial resource strain: Not on file  . Food insecurity:    Worry: Not on file    Inability: Not on file  . Transportation needs:    Medical: Not on file    Non-medical: Not on file  Tobacco Use  . Smoking status: Never Smoker  . Smokeless tobacco: Never Used  Substance and Sexual Activity  . Alcohol use: Yes    Alcohol/week: 0.0 oz  . Drug use: No  . Sexual activity: Not on file  Lifestyle  . Physical activity:    Days per week: Not on file    Minutes per session: Not on file  . Stress: Not on file  Relationships  . Social connections:    Talks on phone: Not on file    Gets together: Not on file   Attends religious service: Not on file    Active member of club or organization: Not on file    Attends meetings of clubs or organizations: Not on file    Relationship status: Not on file  Other Topics Concern  . Not on file  Social History Narrative  . Not on file    Outpatient Encounter Medications as of 10/30/2017  Medication Sig  . Calcium Citrate (CITRACAL PO) Take 1 tablet by mouth daily.  . IBUPROFEN ADVANCED FORMULA PO Take by mouth as needed.   . lisinopril (PRINIVIL,ZESTRIL) 10 MG tablet Take 1 tablet (10 mg total) by mouth daily.  . Omega-3 Fatty Acids (FISH OIL) 600 MG CAPS Take 1 tablet by mouth daily.  . triamcinolone (NASACORT) 55 MCG/ACT nasal inhaler Place 2 sprays into the nose daily.  . [DISCONTINUED] AFLURIA QUADRIVALENT 0.5 ML injection TO BE ADMINISTERED BY PHARMACIST FOR IMMUNIZATION  . [DISCONTINUED] YUVAFEM 10 MCG TABS vaginal tablet INSERT 1 TABLET INTO VAGINA TWICE A WEEK AS DIRECTED   No facility-administered encounter medications on   file as of 10/30/2017.     Review of Systems  Constitutional: Negative for appetite change and unexpected weight change.  HENT: Negative for congestion and sinus pressure.   Respiratory: Negative for cough, chest tightness and shortness of breath.   Cardiovascular: Negative for chest pain, palpitations and leg swelling.  Gastrointestinal: Negative for abdominal pain, diarrhea and nausea.  Genitourinary: Negative for difficulty urinating and dysuria.  Musculoskeletal: Negative for joint swelling and myalgias.  Skin: Negative for color change and rash.  Neurological: Negative for dizziness, light-headedness and headaches.  Psychiatric/Behavioral: Negative for agitation and dysphoric mood.       Objective:    Physical Exam  Constitutional: She appears well-developed and well-nourished. No distress.  HENT:  Nose: Nose normal.  Mouth/Throat: Oropharynx is clear and moist.  Neck: Neck supple. No thyromegaly present.    Cardiovascular: Normal rate and regular rhythm.  Pulmonary/Chest: Breath sounds normal. No respiratory distress. She has no wheezes.  Abdominal: Soft. Bowel sounds are normal. There is no tenderness.  Musculoskeletal: She exhibits no edema or tenderness.  DP pulses palpable and equal bilaterally.    Lymphadenopathy:    She has no cervical adenopathy.  Skin: No rash noted. No erythema.  Psychiatric: She has a normal mood and affect. Her behavior is normal.    BP 122/64 (BP Location: Left Arm, Patient Position: Sitting, Cuff Size: Normal)   Pulse 66   Temp 98.2 F (36.8 C) (Oral)   Resp 16   Wt 111 lb 6.4 oz (50.5 kg)   LMP 07/26/2003   SpO2 96%   BMI 19.12 kg/m  Wt Readings from Last 3 Encounters:  10/30/17 111 lb 6.4 oz (50.5 kg)  07/02/17 113 lb 9.6 oz (51.5 kg)  05/02/17 111 lb 9.6 oz (50.6 kg)     Lab Results  Component Value Date   WBC 5.4 02/01/2017   HGB 13.4 02/01/2017   HCT 40.5 02/01/2017   PLT 243.0 02/01/2017   GLUCOSE 93 10/30/2017   CHOL 201 (H) 05/22/2017   TRIG 56.0 05/22/2017   HDL 85.20 05/22/2017   LDLDIRECT 113.2 08/07/2012   LDLCALC 104 (H) 05/22/2017   ALT 20 05/22/2017   AST 23 05/22/2017   NA 140 10/30/2017   K 4.3 10/30/2017   CL 101 10/30/2017   CREATININE 0.82 10/30/2017   BUN 22 10/30/2017   CO2 32 10/30/2017   TSH 3.26 02/01/2017   HGBA1C 5.9 05/22/2017       Assessment & Plan:   Problem List Items Addressed This Visit    Hyperglycemia    Follow met b and a1c.       Relevant Orders   Hemoglobin A1c   Hypertension, essential    Doing well on lisinopril.  Follow pressures.  Follow metabolic panel.        Relevant Orders   Basic metabolic panel (Completed)   CBC with Differential/Platelet   TSH   Lipid panel   Hepatic function panel   Basic metabolic panel   Osteopenia    Last bone density improved.  Continue weight bearing exercise.  Will schedule f/u bone density at her physical.        Relevant Orders    VITAMIN D 25 Hydroxy (Vit-D Deficiency, Fractures)    Other Visit Diagnoses    Left ankle swelling    -  Primary   No significant swelling on exam.  Worse when day progresses.  DP pulses palpable and equal bilaterally.  Compression Hose.  Follow.  Einar Pheasant, MD

## 2017-10-30 NOTE — Assessment & Plan Note (Signed)
Doing well on lisinopril.  Follow pressures.  Follow metabolic panel.

## 2017-10-31 ENCOUNTER — Encounter: Payer: Self-pay | Admitting: Internal Medicine

## 2017-10-31 NOTE — Assessment & Plan Note (Signed)
Last bone density improved.  Continue weight bearing exercise.  Will schedule f/u bone density at her physical.

## 2017-10-31 NOTE — Assessment & Plan Note (Signed)
Follow met b and a1c.  

## 2018-01-05 ENCOUNTER — Other Ambulatory Visit: Payer: Self-pay | Admitting: Internal Medicine

## 2018-03-11 ENCOUNTER — Encounter: Payer: BC Managed Care – PPO | Admitting: Internal Medicine

## 2018-03-21 ENCOUNTER — Encounter: Payer: Self-pay | Admitting: Internal Medicine

## 2018-03-21 ENCOUNTER — Ambulatory Visit (INDEPENDENT_AMBULATORY_CARE_PROVIDER_SITE_OTHER): Payer: BC Managed Care – PPO | Admitting: Internal Medicine

## 2018-03-21 VITALS — BP 124/78 | HR 61 | Temp 97.4°F | Resp 18 | Ht 64.0 in | Wt 113.6 lb

## 2018-03-21 DIAGNOSIS — M8580 Other specified disorders of bone density and structure, unspecified site: Secondary | ICD-10-CM

## 2018-03-21 DIAGNOSIS — E2839 Other primary ovarian failure: Secondary | ICD-10-CM | POA: Diagnosis not present

## 2018-03-21 DIAGNOSIS — I10 Essential (primary) hypertension: Secondary | ICD-10-CM

## 2018-03-21 DIAGNOSIS — Z0000 Encounter for general adult medical examination without abnormal findings: Secondary | ICD-10-CM | POA: Diagnosis not present

## 2018-03-21 DIAGNOSIS — Z1239 Encounter for other screening for malignant neoplasm of breast: Secondary | ICD-10-CM | POA: Diagnosis not present

## 2018-03-21 DIAGNOSIS — R739 Hyperglycemia, unspecified: Secondary | ICD-10-CM

## 2018-03-21 NOTE — Assessment & Plan Note (Signed)
Physical today 03/21/18.  PAP 01/17/16 - negative with negative HPV.  Mammogram - need to scheduled.  Colonoscopy 2015.  Schedule f/u in 2020.

## 2018-03-21 NOTE — Progress Notes (Signed)
[name], female   DOB: Sep 11, 1955, 62 y.o.   MRN: [id number]   Subjective:    [name], female    DOB: [birth year], 62 y.o.   MRN: [id number]  HPI  Patient here for her physical exam.  She reports she is doing well.  Feels good.  Stays active.  No chest pain.  No sob.  No acid reflux.  No abdominal pain.  Bowels moving.  No urine change.  Blood pressure doing well.     Past Medical History:  Diagnosis Date  . Allergy   . Colon polyps   . Hearing loss, sensorineural 1993  . HSV-1 (herpes simplex virus 1) infection   . Osteopenia   . Postmenopausal    Past Surgical History:  Procedure Laterality Date  . BREAST BIOPSY  1977   Family History  Problem Relation Age of Onset  . Heart disease Father        CHF  . Diabetes Father    Social History   Socioeconomic History  . Marital status: Married    Spouse name: Not on file  . Number of children: Not on file  . Years of education: Not on file  . Highest education level: Not on file  Occupational History  . Not on file  Social Needs  . Financial resource strain: Not on file  . Food insecurity:    Worry: Not on file    Inability: Not on file  . Transportation needs:    Medical: Not on file    Non-medical: Not on file  Tobacco Use  . Smoking status: Never Smoker  . Smokeless tobacco: Never Used  Substance and Sexual Activity  . Alcohol use: Yes    Alcohol/week: 0.0 standard drinks  . Drug use: No  . Sexual activity: Not on file  Lifestyle  . Physical activity:    Days per week: Not on file    Minutes per session: Not on file  . Stress: Not on file  Relationships  . Social connections:    Talks on phone: Not on file    Gets together: Not on file    Attends religious service: Not on file    Active member of club or organization: Not on file    Attends meetings of clubs or organizations: Not on file    Relationship status: Not on file  Other Topics Concern  . Not on file  Social  History Narrative  . Not on file    Outpatient Encounter Medications as of 03/21/2018  Medication Sig  . Calcium Citrate (CITRACAL PO) Take 1 tablet by mouth daily.  . IBUPROFEN ADVANCED FORMULA PO Take by mouth as needed.   Marland Kitchen lisinopril (PRINIVIL,ZESTRIL) 10 MG tablet TAKE 1 TABLET BY MOUTH EVERY DAY  . Omega-3 Fatty Acids (FISH OIL) 600 MG CAPS Take 1 tablet by mouth daily.  Marland Kitchen triamcinolone (NASACORT) 55 MCG/ACT nasal inhaler Place 2 sprays into the nose daily.   No facility-administered encounter medications on file as of 03/21/2018.     Review of Systems  Constitutional: Negative for appetite change and unexpected weight change.  HENT: Negative for congestion and sinus pressure.   Eyes: Negative for pain and visual disturbance.  Respiratory: Negative for cough, chest tightness and shortness of breath.   Cardiovascular: Negative for chest pain, palpitations and leg swelling.  Gastrointestinal: Negative for abdominal pain, diarrhea, nausea and vomiting.  Genitourinary: Negative for difficulty urinating and dysuria.  Musculoskeletal: Negative for  joint swelling and myalgias.  Skin: Negative for color change and rash.  Neurological: Negative for dizziness, light-headedness and headaches.  Hematological: Negative for adenopathy. Does not bruise/bleed easily.  Psychiatric/Behavioral: Negative for agitation and dysphoric mood.       Objective:    Physical Exam  Constitutional: She is oriented to person, place, and time. She appears well-developed and well-nourished. No distress.  HENT:  Nose: Nose normal.  Mouth/Throat: Oropharynx is clear and moist.  Eyes: Right eye exhibits no discharge. Left eye exhibits no discharge. No scleral icterus.  Neck: Neck supple. No thyromegaly present.  Cardiovascular: Normal rate and regular rhythm.  Pulmonary/Chest: Breath sounds normal. No accessory muscle usage. No tachypnea. No respiratory distress. She has no decreased breath sounds. She has  no wheezes. She has no rhonchi. Right breast exhibits no inverted nipple, no mass, no nipple discharge and no tenderness (no axillary adenopathy). Left breast exhibits no inverted nipple, no mass, no nipple discharge and no tenderness (no axilarry adenopathy).  Abdominal: Soft. Bowel sounds are normal. There is no tenderness.  Musculoskeletal: She exhibits no edema or tenderness.  Lymphadenopathy:    She has no cervical adenopathy.  Neurological: She is alert and oriented to person, place, and time.  Skin: No rash noted. No erythema.  Psychiatric: She has a normal mood and affect. Her behavior is normal.    BP 124/78 (BP Location: Left Arm, Patient Position: Sitting, Cuff Size: Normal)   Pulse 61   Temp (!) 97.4 F (36.3 C) (Oral)   Resp 18   Ht '5\' 4"'  (1.626 m)   Wt 113 lb 9.6 oz (51.5 kg)   LMP 07/26/2003   SpO2 97%   BMI 19.50 kg/m  Wt Readings from Last 3 Encounters:  03/21/18 113 lb 9.6 oz (51.5 kg)  10/30/17 111 lb 6.4 oz (50.5 kg)  07/02/17 113 lb 9.6 oz (51.5 kg)     Lab Results  Component Value Date   WBC 5.4 02/01/2017   HGB 13.4 02/01/2017   HCT 40.5 02/01/2017   PLT 243.0 02/01/2017   GLUCOSE 93 10/30/2017   CHOL 201 (H) 05/22/2017   TRIG 56.0 05/22/2017   HDL 85.20 05/22/2017   LDLDIRECT 113.2 08/07/2012   LDLCALC 104 (H) 05/22/2017   ALT 20 05/22/2017   AST 23 05/22/2017   NA 140 10/30/2017   K 4.3 10/30/2017   CL 101 10/30/2017   CREATININE 0.82 10/30/2017   BUN 22 10/30/2017   CO2 32 10/30/2017   TSH 3.26 02/01/2017   HGBA1C 5.9 05/22/2017       Assessment & Plan:   Problem List Items Addressed This Visit    Health care maintenance    Physical today 03/21/18.  PAP 01/17/16 - negative with negative HPV.  Mammogram - need to scheduled.  Colonoscopy 2015.  Schedule f/u in 2020.        Hyperglycemia    Low carb diet and exercise.  Follow met b and a1c.        Hypertension, essential    Blood pressure under good control.  Continue same  medication regimen.  Follow pressures.  Follow metabolic panel.        Osteopenia    Schedule f/u bone density.        Relevant Orders   DG Bone Density    Other Visit Diagnoses    Routine general medical examination at a health care facility    -  Primary   Breast cancer screening  Relevant Orders   MM 3D SCREEN BREAST BILATERAL   Estrogen deficiency           Einar Pheasant, MD

## 2018-03-24 ENCOUNTER — Encounter: Payer: Self-pay | Admitting: Internal Medicine

## 2018-03-24 NOTE — Assessment & Plan Note (Signed)
Blood pressure under good control.  Continue same medication regimen.  Follow pressures.  Follow metabolic panel.   

## 2018-03-24 NOTE — Assessment & Plan Note (Signed)
Low carb diet and exercise.  Follow met b and a1c.   

## 2018-03-24 NOTE — Assessment & Plan Note (Signed)
Schedule f/u bone density.

## 2018-03-28 ENCOUNTER — Ambulatory Visit: Payer: Self-pay

## 2018-03-28 ENCOUNTER — Other Ambulatory Visit: Payer: Self-pay

## 2018-03-28 ENCOUNTER — Emergency Department: Payer: BC Managed Care – PPO

## 2018-03-28 ENCOUNTER — Encounter: Payer: Self-pay | Admitting: Radiology

## 2018-03-28 ENCOUNTER — Inpatient Hospital Stay
Admission: EM | Admit: 2018-03-28 | Discharge: 2018-04-02 | DRG: 194 | Disposition: A | Discharge status: Home / Self Care | Payer: BC Managed Care – PPO | Attending: Internal Medicine | Admitting: Internal Medicine

## 2018-03-28 DIAGNOSIS — I10 Essential (primary) hypertension: Secondary | ICD-10-CM | POA: Diagnosis present

## 2018-03-28 DIAGNOSIS — J181 Lobar pneumonia, unspecified organism: Principal | ICD-10-CM | POA: Diagnosis present

## 2018-03-28 DIAGNOSIS — R509 Fever, unspecified: Secondary | ICD-10-CM

## 2018-03-28 DIAGNOSIS — Z8669 Personal history of other diseases of the nervous system and sense organs: Secondary | ICD-10-CM | POA: Diagnosis not present

## 2018-03-28 DIAGNOSIS — Z86010 Personal history of colonic polyps: Secondary | ICD-10-CM | POA: Diagnosis not present

## 2018-03-28 DIAGNOSIS — Z9889 Other specified postprocedural states: Secondary | ICD-10-CM

## 2018-03-28 DIAGNOSIS — Z833 Family history of diabetes mellitus: Secondary | ICD-10-CM | POA: Diagnosis not present

## 2018-03-28 DIAGNOSIS — Z8249 Family history of ischemic heart disease and other diseases of the circulatory system: Secondary | ICD-10-CM | POA: Diagnosis not present

## 2018-03-28 DIAGNOSIS — Z79899 Other long term (current) drug therapy: Secondary | ICD-10-CM | POA: Diagnosis not present

## 2018-03-28 DIAGNOSIS — R0789 Other chest pain: Secondary | ICD-10-CM | POA: Diagnosis not present

## 2018-03-28 DIAGNOSIS — R918 Other nonspecific abnormal finding of lung field: Secondary | ICD-10-CM | POA: Diagnosis not present

## 2018-03-28 DIAGNOSIS — M8580 Other specified disorders of bone density and structure, unspecified site: Secondary | ICD-10-CM | POA: Diagnosis present

## 2018-03-28 DIAGNOSIS — J90 Pleural effusion, not elsewhere classified: Secondary | ICD-10-CM | POA: Diagnosis present

## 2018-03-28 DIAGNOSIS — K5900 Constipation, unspecified: Secondary | ICD-10-CM | POA: Diagnosis present

## 2018-03-28 DIAGNOSIS — R079 Chest pain, unspecified: Secondary | ICD-10-CM

## 2018-03-28 DIAGNOSIS — J189 Pneumonia, unspecified organism: Secondary | ICD-10-CM

## 2018-03-28 HISTORY — DX: Essential (primary) hypertension: I10

## 2018-03-28 LAB — URINALYSIS, COMPLETE (UACMP) WITH MICROSCOPIC
BILIRUBIN URINE: NEGATIVE
Bacteria, UA: NONE SEEN
Glucose, UA: NEGATIVE mg/dL
KETONES UR: NEGATIVE mg/dL
LEUKOCYTES UA: NEGATIVE
Nitrite: NEGATIVE
Protein, ur: 100 mg/dL — AB
Specific Gravity, Urine: 1.023 (ref 1.005–1.030)
pH: 5 (ref 5.0–8.0)

## 2018-03-28 LAB — COMPREHENSIVE METABOLIC PANEL
ALT: 19 U/L (ref 0–44)
ANION GAP: 8 (ref 5–15)
AST: 22 U/L (ref 15–41)
Albumin: 3.3 g/dL — ABNORMAL LOW (ref 3.5–5.0)
Alkaline Phosphatase: 57 U/L (ref 38–126)
BILIRUBIN TOTAL: 0.6 mg/dL (ref 0.3–1.2)
BUN: 21 mg/dL (ref 8–23)
CO2: 27 mmol/L (ref 22–32)
Calcium: 8.7 mg/dL — ABNORMAL LOW (ref 8.9–10.3)
Chloride: 100 mmol/L (ref 98–111)
Creatinine, Ser: 1.01 mg/dL — ABNORMAL HIGH (ref 0.44–1.00)
GFR calc Af Amer: >60 mL/min (ref >60)
GFR calc non Af Amer: 58 mL/min — ABNORMAL LOW (ref >60)
GLUCOSE: 147 mg/dL — AB (ref 70–99)
POTASSIUM: 3.6 mmol/L (ref 3.5–5.1)
SODIUM: 135 mmol/L (ref 135–145)
TOTAL PROTEIN: 6.6 g/dL (ref 6.5–8.1)

## 2018-03-28 LAB — CBC
HEMATOCRIT: 37 % (ref 36.0–46.0)
HEMOGLOBIN: 12.1 g/dL (ref 12.0–15.0)
MCH: 29.9 pg (ref 26.0–34.0)
MCHC: 32.7 g/dL (ref 30.0–36.0)
MCV: 91.4 fL (ref 80.0–100.0)
Platelets: 228 10*3/uL (ref 150–400)
RBC: 4.05 MIL/uL (ref 3.87–5.11)
RDW: 13.3 % (ref 11.5–15.5)
WBC: 10.9 10*3/uL — ABNORMAL HIGH (ref 4.0–10.5)
nRBC: 0 % (ref 0.0–0.2)

## 2018-03-28 LAB — LIPASE, BLOOD: LIPASE: 25 U/L (ref 11–51)

## 2018-03-28 LAB — LACTIC ACID, PLASMA: Lactic Acid, Venous: 1.2 mmol/L (ref 0.5–1.9)

## 2018-03-28 LAB — TROPONIN I: Troponin I: <0.03 ng/mL (ref <0.03)

## 2018-03-28 MED ORDER — ALBUTEROL SULFATE (2.5 MG/3ML) 0.083% IN NEBU
2.5000 mg | INHALATION_SOLUTION | Freq: Once | RESPIRATORY_TRACT | Status: AC
  Administered 2018-03-28: 2.5 mg via RESPIRATORY_TRACT
  Filled 2018-03-28: qty 3

## 2018-03-28 MED ORDER — SODIUM CHLORIDE 0.9 % IV SOLN
2.0000 g | INTRAVENOUS | Status: DC
  Administered 2018-03-28 – 2018-03-31 (×4): 2 g via INTRAVENOUS
  Filled 2018-03-28 (×2): qty 2
  Filled 2018-03-28: qty 20
  Filled 2018-03-28: qty 2
  Filled 2018-03-28: qty 20

## 2018-03-28 MED ORDER — FENTANYL CITRATE (PF) 100 MCG/2ML IJ SOLN
50.0000 ug | INTRAMUSCULAR | Status: DC | PRN
  Administered 2018-03-28: 50 ug via INTRAVENOUS
  Filled 2018-03-28: qty 2

## 2018-03-28 MED ORDER — ONDANSETRON HCL 4 MG/2ML IJ SOLN
4.0000 mg | Freq: Once | INTRAMUSCULAR | Status: AC
  Administered 2018-03-28: 4 mg via INTRAVENOUS
  Filled 2018-03-28: qty 2

## 2018-03-28 MED ORDER — SODIUM CHLORIDE 0.9 % IV SOLN
500.0000 mg | INTRAVENOUS | Status: DC
  Administered 2018-03-28 – 2018-03-31 (×4): 500 mg via INTRAVENOUS
  Filled 2018-03-28 (×5): qty 500

## 2018-03-28 MED ORDER — IOHEXOL 350 MG/ML SOLN
75.0000 mL | Freq: Once | INTRAVENOUS | Status: AC | PRN
  Administered 2018-03-28: 75 mL via INTRAVENOUS

## 2018-03-28 NOTE — ED Provider Notes (Signed)
[facility] Emergency Department Provider Note    First MD Initiated Contact with Patient 03/28/18 2020     (approximate)  I have reviewed the triage vital signs and the nursing notes.   HISTORY  Chief Complaint Chest Pain; Fever; and Nausea    HPI Allyah Heather is a 62 y.o. female presents after recent trip to [place] with left-sided chest pain pleuritic in nature.  States is mild to moderate and worsened with movement.  Denies any measured fevers.  No cough.  Previously healthy.  Does not smoke.  Denies any abdominal pain.  No lower extremity swelling.  No history of heart disease.    Past Medical History:  Diagnosis Date  . Allergy   . Colon polyps   . Hearing loss, sensorineural 1993  . HSV-1 (herpes simplex virus 1) infection   . Hypertension   . Osteopenia   . Postmenopausal    Family History  Problem Relation Age of Onset  . Heart disease Father        CHF  . Diabetes Father    Past Surgical History:  Procedure Laterality Date  . BREAST BIOPSY  1977   Patient Active Problem List   Diagnosis Date Noted  . CAP (community acquired pneumonia) 03/28/2018  . Hypertension, essential 05/05/2017  . Constipation 06/09/2016  . Health care maintenance 01/13/2015  . Leg skin lesion, left 10/05/2013  . Hyperglycemia 08/04/2012  . Osteopenia 08/04/2012  . History of colonic polyps 08/04/2012      Prior to Admission medications   Medication Sig Start Date End Date Taking? Authorizing Provider  Calcium Citrate (CITRACAL PO) Take 1 tablet by mouth daily.   Yes [provider]  lisinopril (PRINIVIL,ZESTRIL) 10 MG tablet TAKE 1 TABLET BY MOUTH EVERY DAY 01/07/18  Yes Einar Pheasant, MD  Omega-3 Fatty Acids (FISH OIL) 600 MG CAPS Take 1 tablet by mouth daily.   Yes [provider]  triamcinolone (NASACORT) 55 MCG/ACT nasal inhaler Place 2 sprays into the nose daily.   Yes [provider]  IBUPROFEN ADVANCED  FORMULA PO Take by mouth as needed.     [provider]    Allergies Patient has no known allergies.    Social History Social History   Tobacco Use  . Smoking status: Never Smoker  . Smokeless tobacco: Never Used  Substance Use Topics  . Alcohol use: Yes    Alcohol/week: 0.0 standard drinks  . Drug use: No    Review of Systems Patient denies headaches, rhinorrhea, blurry vision, numbness, shortness of breath, chest pain, edema, cough, abdominal pain, nausea, vomiting, diarrhea, dysuria, fevers, rashes or hallucinations unless otherwise stated above in HPI. ____________________________________________   PHYSICAL EXAM:  VITAL SIGNS: Vitals:   03/28/18 2056 03/28/18 2330  BP: 129/60 124/63  Pulse: 90 95  Resp: (!) 27 (!) 26  Temp:    SpO2: 97% 97%    Constitutional: Alert and oriented.  Eyes: Conjunctivae are normal.  Head: Atraumatic. Nose: No congestion/rhinnorhea. Mouth/Throat: Mucous membranes are moist.   Neck: No stridor. Painless ROM.  Cardiovascular: Normal rate, regular rhythm. Grossly normal heart sounds.  Good peripheral circulation. Respiratory: Normal respiratory effort.  No retractions. Lungs with coarse bibasilar breathsounds Gastrointestinal: Soft and nontender. No distention. No abdominal bruits. No CVA tenderness. Genitourinary:  Musculoskeletal: No lower extremity tenderness nor edema.  No joint effusions. Neurologic:  Normal speech and language. No gross focal neurologic deficits are appreciated. No facial droop Skin:  Skin is warm,  dry and intact. No rash noted. Psychiatric: Mood and affect are normal. Speech and behavior are normal.  ____________________________________________   LABS (all labs ordered are listed, but only abnormal results are displayed)  Results for orders placed or performed during the hospital encounter of 03/28/18 (from the past 24 hour(s))  CBC     Status: Abnormal   Collection Time: 03/28/18  5:39 PM    Result Value Ref Range   WBC 10.9 (H) 4.0 - 10.5 K/uL   RBC 4.05 3.87 - 5.11 MIL/uL   Hemoglobin 12.1 12.0 - 15.0 g/dL   HCT 37.0 36.0 - 46.0 %   MCV 91.4 80.0 - 100.0 fL   MCH 29.9 26.0 - 34.0 pg   MCHC 32.7 30.0 - 36.0 g/dL   RDW 13.3 11.5 - 15.5 %   Platelets 228 150 - 400 K/uL   nRBC 0.0 0.0 - 0.2 %  Troponin I     Status: None   Collection Time: 03/28/18  5:39 PM  Result Value Ref Range   Troponin I <0.03 <0.03 ng/mL  Lipase, blood     Status: None   Collection Time: 03/28/18  5:39 PM  Result Value Ref Range   Lipase 25 11 - 51 U/L  Comprehensive metabolic panel     Status: Abnormal   Collection Time: 03/28/18  5:39 PM  Result Value Ref Range   Sodium 135 135 - 145 mmol/L   Potassium 3.6 3.5 - 5.1 mmol/L   Chloride 100 98 - 111 mmol/L   CO2 27 22 - 32 mmol/L   Glucose, Bld 147 (H) 70 - 99 mg/dL   BUN 21 8 - 23 mg/dL   Creatinine, Ser 1.01 (H) 0.44 - 1.00 mg/dL   Calcium 8.7 (L) 8.9 - 10.3 mg/dL   Total Protein 6.6 6.5 - 8.1 g/dL   Albumin 3.3 (L) 3.5 - 5.0 g/dL   AST 22 15 - 41 U/L   ALT 19 0 - 44 U/L   Alkaline Phosphatase 57 38 - 126 U/L   Total Bilirubin 0.6 0.3 - 1.2 mg/dL   GFR calc non Af Amer 58 (L) >60 mL/min   GFR calc Af Amer >60 >60 mL/min   Anion gap 8 5 - 15  Urinalysis, Complete w Microscopic     Status: Abnormal   Collection Time: 03/28/18  5:39 PM  Result Value Ref Range   Color, Urine YELLOW (A) YELLOW   APPearance CLEAR (A) CLEAR   Specific Gravity, Urine 1.023 1.005 - 1.030   pH 5.0 5.0 - 8.0   Glucose, UA NEGATIVE NEGATIVE mg/dL   Hgb urine dipstick SMALL (A) NEGATIVE   Bilirubin Urine NEGATIVE NEGATIVE   Ketones, ur NEGATIVE NEGATIVE mg/dL   Protein, ur 100 (A) NEGATIVE mg/dL   Nitrite NEGATIVE NEGATIVE   Leukocytes, UA NEGATIVE NEGATIVE   RBC / HPF 0-5 0 - 5 RBC/hpf   WBC, UA 0-5 0 - 5 WBC/hpf   Bacteria, UA NONE SEEN NONE SEEN   Squamous Epithelial / LPF 0-5 0 - 5   Mucus PRESENT   Lactic acid, plasma     Status: None    Collection Time: 03/28/18  5:39 PM  Result Value Ref Range   Lactic Acid, Venous 1.2 0.5 - 1.9 mmol/L   ____________________________________________  EKG My review and personal interpretation at Time: 17:30   Indication: chest pain  Rate: 85  Rhythm: sinus Axis: normal Other: normal intervals, no stemi ____________________________________________  RADIOLOGY  I personally reviewed  all radiographic images ordered to evaluate for the above acute complaints and reviewed radiology reports and findings.  These findings were personally discussed with the patient.  Please see medical record for radiology report.  ____________________________________________   PROCEDURES  Procedure(s) performed:  Procedures    Critical Care performed: no ____________________________________________   INITIAL IMPRESSION / ASSESSMENT AND PLAN / ED COURSE  Pertinent labs & imaging results that were available during my care of the patient were reviewed by me and considered in my medical decision making (see chart for details).   DDX: Asthma, copd, CHF, pna, ptx, malignancy, Pe, anemia   Magaret Justo is a 62 y.o. who presents to the ED with was as described above.  Patient with no temperature mildly elevated white count.  Chest x-ray does show evidence of bilateral effusions left greater than right.  Given recent travel and nonspecific presentation will order CT imaging to further evaluate.  Clinical Course as of Mar 28 2358  Thu Mar 28, 2018  2143 Given the patient's effusion and shortness of breath recent travel CT imaging ordered to evaluate for some versus malignancy.   [PR]    Clinical Course User Index [PR] Merlyn Lot, MD     As part of my medical decision making, I reviewed the following data within the [facility] notes reviewed and incorporated, Labs reviewed, notes from prior ED visits and [place] Controlled Substance  Database   ____________________________________________   FINAL CLINICAL IMPRESSION(S) / ED DIAGNOSES  Final diagnoses:  Chest pain, unspecified type  Community acquired pneumonia of left lower lobe of lung ([place])      NEW MEDICATIONS STARTED DURING THIS VISIT:  New Prescriptions   No medications on file     Note:  This document was prepared using Dragon voice recognition software and may include unintentional dictation errors.    Merlyn Lot, MD 03/28/18 [phone]

## 2018-03-28 NOTE — ED Notes (Signed)
Pt resting comfortably, awaiting admission.

## 2018-03-28 NOTE — ED Notes (Signed)
Pt ambulated without difficulty, sats 92-93% on ambulation.

## 2018-03-28 NOTE — Telephone Encounter (Signed)
Left message for patient to call back  

## 2018-03-28 NOTE — Telephone Encounter (Signed)
FYI

## 2018-03-28 NOTE — Telephone Encounter (Signed)
If fever and severe pain, agree with need for evaluation now - may need ER.  Please confirm pt being seen.

## 2018-03-28 NOTE — ED Notes (Signed)
Pt in with co left chest pain states under left breast and left rib area. Started on Monday worse when she takes a deep breath or coughs. STates no shob but feels like she cannot take deep breaths. Pt has slight dry cough, also co fever at home with generalized weakness. Did have some vomiting last 2 days none today. Pt denies any dysuria or hx of the same. She did go to [facility] today for the same and had cxr done. She was sent here for possible pneumonia and "fluid in lungs".

## 2018-03-28 NOTE — ED Triage Notes (Signed)
Pt arrived via POV from [facility] with reports of dull, constant, chest pain since yesterday on the left side radiating down from the breast to the left hip that worsens with certain movement.   Pt also reports fever since Monday. Pt states highest temp was today 102.7 at [facility]. Pt took 400mg  of ibuprofen around 1500 today.  Pt states she vomited yesterday and Tuesday night.  Pt returned from a cruise yesterday. Pt states her cruise was to [place], [place] and [place].

## 2018-03-28 NOTE — ED Notes (Signed)
Pt resting comfortably awaiting admission.  

## 2018-03-28 NOTE — Telephone Encounter (Signed)
Patient called in with c/o "left side pain and fever." She says "I've been having the fever for a couple of days, but last night my left side starting hurting all the way up to my left breast on the side. The pain is so bad I yell out. I take Ibuprofen for the fever, but it did nothing for the pain." I asked about urination, she says "I'm not having any problems with my urine." I asked about other symptoms, she says "just the fever and I vomited twice and having some weakness from the fever." As I was talking to the patient, she was breathing fast and appears she was having SOB, I asked the patient is she having SOB, she says "no, but it's hard to take in deep breaths from the pain." According to protocol, see PCP within 4 hours, advised no availability today with PCP, advised to go to UC or I could check another practice for availability. She asked if I could check another practice, I checked [facility] and advised no availability today. She says she will go to the UC at [facility], care advice given, patient verbalized understanding.  Reason for Disposition . Fever > 100.5 F (38.1 C)  Answer Assessment - Initial Assessment Questions 1. LOCATION: "Where does it hurt?" (e.g., left, right)     Left side 2. ONSET: "When did the pain start?"     Last night 3. SEVERITY: "How bad is the pain?" (e.g., Scale 1-10; mild, moderate, or severe)   - MILD (1-3): doesn't interfere with normal activities    - MODERATE (4-7): interferes with normal activities or awakens from sleep    - SEVERE (8-10): excruciating pain and patient unable to do normal activities (stays in bed)       Severe 4. PATTERN: "Does the pain come and go, or is it constant?"      Constant 5. CAUSE: "What do you think is causing the pain?"     I don't know 6. OTHER SYMPTOMS:  "Do you have any other symptoms?" (e.g., fever, abdominal pain, vomiting, leg weakness, burning with urination, blood in urine)     Fever, vomited x 2 different  nights 7. PREGNANCY:  "Is there any chance you are pregnant?" "When was your last menstrual period?"     No  Protocols used: FLANK PAIN-A-AH

## 2018-03-29 ENCOUNTER — Other Ambulatory Visit: Payer: Self-pay

## 2018-03-29 DIAGNOSIS — Z79899 Other long term (current) drug therapy: Secondary | ICD-10-CM

## 2018-03-29 DIAGNOSIS — R0789 Other chest pain: Secondary | ICD-10-CM

## 2018-03-29 DIAGNOSIS — R918 Other nonspecific abnormal finding of lung field: Secondary | ICD-10-CM

## 2018-03-29 DIAGNOSIS — I10 Essential (primary) hypertension: Secondary | ICD-10-CM

## 2018-03-29 DIAGNOSIS — R509 Fever, unspecified: Secondary | ICD-10-CM

## 2018-03-29 DIAGNOSIS — Z8669 Personal history of other diseases of the nervous system and sense organs: Secondary | ICD-10-CM

## 2018-03-29 LAB — RESPIRATORY PANEL BY PCR
Adenovirus: NOT DETECTED
Bordetella pertussis: NOT DETECTED
CORONAVIRUS OC43-RVPPCR: NOT DETECTED
Chlamydophila pneumoniae: NOT DETECTED
Coronavirus 229E: NOT DETECTED
Coronavirus HKU1: NOT DETECTED
Coronavirus NL63: NOT DETECTED
INFLUENZA A-RVPPCR: NOT DETECTED
INFLUENZA B-RVPPCR: NOT DETECTED
METAPNEUMOVIRUS-RVPPCR: NOT DETECTED
Mycoplasma pneumoniae: NOT DETECTED
PARAINFLUENZA VIRUS 2-RVPPCR: NOT DETECTED
PARAINFLUENZA VIRUS 4-RVPPCR: NOT DETECTED
Parainfluenza Virus 1: NOT DETECTED
Parainfluenza Virus 3: NOT DETECTED
RESPIRATORY SYNCYTIAL VIRUS-RVPPCR: NOT DETECTED
Rhinovirus / Enterovirus: NOT DETECTED

## 2018-03-29 LAB — PROCALCITONIN
PROCALCITONIN: 1.58 ng/mL
Procalcitonin: 1.68 ng/mL

## 2018-03-29 LAB — CBC
HEMATOCRIT: 35.4 % — AB (ref 36.0–46.0)
HEMOGLOBIN: 11.5 g/dL — AB (ref 12.0–15.0)
MCH: 29.7 pg (ref 26.0–34.0)
MCHC: 32.5 g/dL (ref 30.0–36.0)
MCV: 91.5 fL (ref 80.0–100.0)
Platelets: 203 10*3/uL (ref 150–400)
RBC: 3.87 MIL/uL (ref 3.87–5.11)
RDW: 13.4 % (ref 11.5–15.5)
WBC: 10.1 10*3/uL (ref 4.0–10.5)
nRBC: 0 % (ref 0.0–0.2)

## 2018-03-29 LAB — BASIC METABOLIC PANEL WITH GFR
Anion gap: 9 (ref 5–15)
BUN: 15 mg/dL (ref 8–23)
CO2: 26 mmol/L (ref 22–32)
Calcium: 8.1 mg/dL — ABNORMAL LOW (ref 8.9–10.3)
Chloride: 99 mmol/L (ref 98–111)
Creatinine, Ser: 0.83 mg/dL (ref 0.44–1.00)
GFR calc Af Amer: >60 mL/min
GFR calc non Af Amer: >60 mL/min
Glucose, Bld: 125 mg/dL — ABNORMAL HIGH (ref 70–99)
Potassium: 3.6 mmol/L (ref 3.5–5.1)
Sodium: 134 mmol/L — ABNORMAL LOW (ref 135–145)

## 2018-03-29 LAB — GLUCOSE, CAPILLARY: Glucose-Capillary: 103 mg/dL — ABNORMAL HIGH (ref 70–99)

## 2018-03-29 LAB — INFLUENZA PANEL BY PCR (TYPE A & B)
INFLBPCR: NEGATIVE
Influenza A By PCR: NEGATIVE

## 2018-03-29 LAB — STREP PNEUMONIAE URINARY ANTIGEN: STREP PNEUMO URINARY ANTIGEN: NEGATIVE

## 2018-03-29 LAB — LACTIC ACID, PLASMA: Lactic Acid, Venous: 1.7 mmol/L (ref 0.5–1.9)

## 2018-03-29 MED ORDER — ACETAMINOPHEN 325 MG PO TABS
650.0000 mg | ORAL_TABLET | Freq: Four times a day (QID) | ORAL | Status: DC | PRN
  Administered 2018-03-29 – 2018-03-31 (×4): 650 mg via ORAL
  Filled 2018-03-29 (×6): qty 2

## 2018-03-29 MED ORDER — TRAZODONE HCL 50 MG PO TABS: 25.0000 mg | ORAL_TABLET | Freq: Every evening | ORAL | Status: DC | PRN

## 2018-03-29 MED ORDER — BISACODYL 5 MG PO TBEC
5.0000 mg | DELAYED_RELEASE_TABLET | Freq: Every day | ORAL | Status: DC | PRN
  Administered 2018-03-30 – 2018-03-31 (×2): 5 mg via ORAL
  Filled 2018-03-29 (×2): qty 1

## 2018-03-29 MED ORDER — ENOXAPARIN SODIUM 40 MG/0.4ML ~~LOC~~ SOLN
40.0000 mg | SUBCUTANEOUS | Status: DC
  Filled 2018-03-29: qty 0.4

## 2018-03-29 MED ORDER — FLUTICASONE PROPIONATE 50 MCG/ACT NA SUSP
1.0000 | Freq: Every day | NASAL | Status: DC
  Administered 2018-03-29 – 2018-04-02 (×5): 1 via NASAL
  Filled 2018-03-29 (×2): qty 16

## 2018-03-29 MED ORDER — SODIUM CHLORIDE 0.9 % IV SOLN
Freq: Once | INTRAVENOUS | Status: AC
  Administered 2018-03-29: 02:00:00 via INTRAVENOUS

## 2018-03-29 MED ORDER — DOCUSATE SODIUM 100 MG PO CAPS
100.0000 mg | ORAL_CAPSULE | Freq: Two times a day (BID) | ORAL | Status: DC
  Administered 2018-03-29 – 2018-03-31 (×7): 100 mg via ORAL
  Filled 2018-03-29 (×8): qty 1

## 2018-03-29 MED ORDER — IBUPROFEN 400 MG PO TABS
400.0000 mg | ORAL_TABLET | Freq: Two times a day (BID) | ORAL | Status: DC
  Administered 2018-03-30 – 2018-04-02 (×7): 400 mg via ORAL
  Filled 2018-03-29 (×8): qty 1

## 2018-03-29 MED ORDER — ONDANSETRON HCL 4 MG PO TABS: 4.0000 mg | ORAL_TABLET | Freq: Four times a day (QID) | ORAL | Status: DC | PRN

## 2018-03-29 MED ORDER — IPRATROPIUM-ALBUTEROL 0.5-2.5 (3) MG/3ML IN SOLN
3.0000 mL | Freq: Four times a day (QID) | RESPIRATORY_TRACT | Status: DC
  Administered 2018-03-29 – 2018-03-30 (×5): 3 mL via RESPIRATORY_TRACT
  Filled 2018-03-29 (×6): qty 3

## 2018-03-29 MED ORDER — LISINOPRIL 10 MG PO TABS: 10.0000 mg | ORAL_TABLET | Freq: Every day | ORAL | Status: DC

## 2018-03-29 MED ORDER — ACETAMINOPHEN 650 MG RE SUPP: 650.0000 mg | Freq: Four times a day (QID) | RECTAL | Status: DC | PRN

## 2018-03-29 MED ORDER — HYDROCODONE-ACETAMINOPHEN 5-325 MG PO TABS
1.0000 | ORAL_TABLET | ORAL | Status: DC | PRN
  Administered 2018-03-29: 1 via ORAL
  Administered 2018-03-29: 2 via ORAL
  Administered 2018-03-29: 01:00:00 1 via ORAL
  Filled 2018-03-29: qty 1
  Filled 2018-03-29 (×2): qty 2

## 2018-03-29 MED ORDER — ONDANSETRON HCL 4 MG/2ML IJ SOLN: 4.0000 mg | Freq: Four times a day (QID) | INTRAMUSCULAR | Status: DC | PRN

## 2018-03-29 NOTE — H&P (Signed)
[place] [facility] [place] NAME: Aimee Gomez    MR#:  [id number]  DATE OF BIRTH:  [birth year]  DATE OF ADMISSION:  03/28/2018  PRIMARY CARE PHYSICIAN: Einar Pheasant, MD   REQUESTING/REFERRING PHYSICIAN:   CHIEF COMPLAINT:   Chief Complaint  Patient presents with  . Chest Pain  . Fever  . Nausea    HISTORY OF PRESENT ILLNESS: Aimee Gomez  is a 62 y.o. female with a known history of hypertension. Patient presented to emergency room for acute onset of pleuritic left-sided chest pain, described as constant, moderate and worse with deep breathing and movement.  She also complains of subjective fevers and chills going on for the past 2 days, gradually getting worse.  Patient just returned from a [place] cruise.  She denies any sick contacts.  No nausea, vomiting, diarrhea, bleeding.  No prior similar episodes.  Patient denies using tobacco or illicit drugs. Blood test done emergency room are notable for slightly elevated WBC at 10.9.  Creatinine level is 1.01. At the arrival to emergency room, patient was noted with tachypnea and tachycardia. CTA of the chest shows patchy bilateral pulmonary infiltrates. The findings are most consistent with an infectious process. However, there are nodular components, with the largest in the right base measuring 3.5 x 2.1 cm. Recommend treatment for infection with a short-term follow-up in approximately 1-2 months to ensure resolution.  There are small bilateral pleural effusions, noted as well.     PAST MEDICAL HISTORY:   Past Medical History:  Diagnosis Date  . Allergy   . Colon polyps   . Hearing loss, sensorineural 1993  . HSV-1 (herpes simplex virus 1) infection   . Hypertension   . Osteopenia   . Postmenopausal     PAST SURGICAL HISTORY:  Past Surgical History:  Procedure Laterality Date  . BREAST BIOPSY  1977    SOCIAL HISTORY:  Social History   Tobacco Use  . Smoking status: Never  Smoker  . Smokeless tobacco: Never Used  Substance Use Topics  . Alcohol use: Yes    Alcohol/week: 0.0 standard drinks    FAMILY HISTORY:  Family History  Problem Relation Age of Onset  . Heart disease Father        CHF  . Diabetes Father     DRUG ALLERGIES: No Known Allergies  REVIEW OF SYSTEMS:   CONSTITUTIONAL: Positive for subjective fevers and chills, fatigue and generalized weakness.  EYES: No changes in vision.  EARS, NOSE, AND THROAT: No tinnitus or ear pain.  RESPIRATORY: No cough, but positive for shortness of breath.  No wheezing or hemoptysis.  CARDIOVASCULAR: Positive for pleuritic chest pain, no orthopnea, edema.  GASTROINTESTINAL: No nausea, vomiting, diarrhea or abdominal pain.  GENITOURINARY: No dysuria, hematuria.  ENDOCRINE: No polyuria, nocturia. HEMATOLOGY: No bleeding. SKIN: No rash or lesion. MUSCULOSKELETAL: No joint pain at this time.   NEUROLOGIC: No focal weakness.  PSYCHIATRY: No anxiety or depression.   MEDICATIONS AT HOME:  Prior to Admission medications   Medication Sig Start Date End Date Taking? Authorizing Provider  Calcium Citrate (CITRACAL PO) Take 1 tablet by mouth daily.   Yes [provider]  lisinopril (PRINIVIL,ZESTRIL) 10 MG tablet TAKE 1 TABLET BY MOUTH EVERY DAY 01/07/18  Yes Einar Pheasant, MD  Omega-3 Fatty Acids (FISH OIL) 600 MG CAPS Take 1 tablet by mouth daily.   Yes [provider]  triamcinolone (NASACORT) 55 MCG/ACT nasal inhaler Place 2 sprays into the  nose daily.   Yes [provider]  IBUPROFEN ADVANCED FORMULA PO Take by mouth as needed.     [provider]      PHYSICAL EXAMINATION:   VITAL SIGNS: Blood pressure 124/63, pulse 95, temperature 98.7 F (37.1 C), temperature source Oral, resp. rate (!) 26, height 5\' 3"  (1.6 m), weight 51.7 kg, last menstrual period 07/26/2003, SpO2 97 %.  GENERAL:  62 y.o.-year-old patient lying in the bed with no acute distress, at rest.  EYES:  Pupils equal, round, reactive to light and accommodation. No scleral icterus. Extraocular muscles intact.  HEENT: Head atraumatic, normocephalic. Oropharynx and nasopharynx clear.  NECK:  Supple, no jugular venous distention. No thyroid enlargement, no tenderness.  LUNGS: Normal breath sounds bilaterally, no wheezing.  Reduced breath sounds at both lung bases noted.  No use of accessory muscles of respiration.  CARDIOVASCULAR: S1, S2 normal. No S3/S4.  ABDOMEN: Soft, nontender, nondistended. Bowel sounds present. No organomegaly or mass.  EXTREMITIES: No pedal edema, cyanosis, or clubbing.  NEUROLOGIC: Cranial nerves II through XII are intact. Muscle strength 5/5 in all extremities. Sensation intact.   PSYCHIATRIC: The patient is alert and oriented x 3.  SKIN: No obvious rash, lesion, or ulcer.   LABORATORY PANEL:   CBC Recent Labs  Lab 03/28/18 1739  WBC 10.9*  HGB 12.1  HCT 37.0  PLT 228  MCV 91.4  MCH 29.9  MCHC 32.7  RDW 13.3   ------------------------------------------------------------------------------------------------------------------  Chemistries  Recent Labs  Lab 03/28/18 1739  NA 135  K 3.6  CL 100  CO2 27  GLUCOSE 147*  BUN 21  CREATININE 1.01*  CALCIUM 8.7*  AST 22  ALT 19  ALKPHOS 57  BILITOT 0.6   ------------------------------------------------------------------------------------------------------------------ estimated creatinine clearance is 46.5 mL/min (A) (by C-G formula based on SCr of 1.01 mg/dL (H)). ------------------------------------------------------------------------------------------------------------------ No results for input(s): TSH, T4TOTAL, T3FREE, THYROIDAB in the last 72 hours.  Invalid input(s): FREET3   Coagulation profile No results for input(s): INR, PROTIME in the last 168 hours. ------------------------------------------------------------------------------------------------------------------- No results for input(s):  DDIMER in the last 72 hours. -------------------------------------------------------------------------------------------------------------------  Cardiac Enzymes Recent Labs  Lab 03/28/18 1739  TROPONINI <0.03   ------------------------------------------------------------------------------------------------------------------ Invalid input(s): POCBNP  ---------------------------------------------------------------------------------------------------------------  Urinalysis    Component Value Date/Time   COLORURINE YELLOW (A) 03/28/2018 1739   APPEARANCEUR CLEAR (A) 03/28/2018 1739   LABSPEC 1.023 03/28/2018 1739   PHURINE 5.0 03/28/2018 1739   GLUCOSEU NEGATIVE 03/28/2018 1739   HGBUR SMALL (A) 03/28/2018 1739   BILIRUBINUR NEGATIVE 03/28/2018 1739   KETONESUR NEGATIVE 03/28/2018 1739   PROTEINUR 100 (A) 03/28/2018 1739   NITRITE NEGATIVE 03/28/2018 1739   LEUKOCYTESUR NEGATIVE 03/28/2018 1739     RADIOLOGY: Dg Chest 2 View  Result Date: 03/28/2018 CLINICAL DATA:  Chest pain EXAM: CHEST - 2 VIEW COMPARISON:  None. FINDINGS: Small bilateral pleural effusions, left greater than right. Airspace disease at the left base. Possible 1 cm nodule in the right lower lung. Normal heart size. No pneumothorax. IMPRESSION: 1. Small left greater than right pleural effusions with left basilar atelectasis or pneumonia 2. Possible 1 cm right lower lung nodule. Suggest chest CT to further evaluate. Electronically Signed   By: Donavan Foil M.D.   On: 03/28/2018 21:10   Ct Angio Chest Pe W And/or Wo Contrast  Result Date: 03/28/2018 CLINICAL DATA:  Dual constant chest pain. EXAM: CT ANGIOGRAPHY CHEST WITH CONTRAST TECHNIQUE: Multidetector CT imaging of the chest was performed using the standard protocol during bolus  administration of intravenous contrast. Multiplanar CT image reconstructions and MIPs were obtained to evaluate the vascular anatomy. CONTRAST:  49mL OMNIPAQUE IOHEXOL 350 MG/ML SOLN  COMPARISON:  Chest x-ray March 28, 2018 FINDINGS: Cardiovascular: No thoracic aortic aneurysm or dissection. The heart size is normal. No pulmonary emboli. Mediastinum/Nodes: Small bilateral pleural effusions, left greater than right. No pericardial effusion. There is a small hiatal hernia. The thyroid is unremarkable. No adenopathy. Lungs/Pleura: Central airways are normal. No pneumothorax. Patchy bilateral pulmonary infiltrates primarily in the right upper lobe, right middle lobe, and bilateral lower lobes. There are nodular components with the largest nodule measuring 3.5 x 2.1 cm in the right base on axial image 48. This correlates with the recent chest x-ray finding. More confluent opacity is seen in the left base and more patchy opacity is seen in the right upper lobe with a 9 mm nodular component on image 33. More mild involvement of the left upper lobe is identified. Upper Abdomen: No acute abnormality. Musculoskeletal: No chest wall abnormality. No acute or significant osseous findings. Review of the MIP images confirms the above findings. IMPRESSION: 1. Patchy bilateral pulmonary infiltrates. The findings are most consistent with an infectious process. However, there are nodular components, with the largest in the right base measuring 3.5 x 2.1 cm. Recommend treatment for infection with a short-term follow-up in approximately 1-2 months to ensure resolution. 2. Small bilateral pleural effusions. 3. Small hiatal hernia. Electronically Signed   By: Dorise Bullion III M.D   On: 03/28/2018 21:54    EKG: Orders placed or performed during the hospital encounter of 03/28/18  . EKG 12-Lead  . EKG 12-Lead  . ED EKG within 10 minutes  . ED EKG within 10 minutes    IMPRESSION AND PLAN:  1. CAP.  Start treatment with antibiotics IV, ceftriaxone and azithromycin, duo nebs and oxygen therapy. CTA of the chest shows patchy bilateral pulmonary infiltrates. The findings are most consistent with an  infectious process. However, there are nodular components, with the largest in the right base measuring 3.5 x 2.1 cm. Recommend treatment for infection with a short-term follow-up in approximately 1-2 months to ensure resolution.  There are small bilateral pleural effusions, noted as well.  Pulmonary specialist is consulted for further evaluation and treatment.  2.  Hypertension, stable, resume home medications.  All the records are reviewed and case discussed with ED provider. Management plans discussed with the patient, who is in agreement.  CODE STATUS: Full    TOTAL TIME TAKING CARE OF THIS PATIENT: 50 minutes.    Amelia Jo M.D on 03/29/2018 at 12:12 AM  Between 7am to 6pm - Pager - [phone]  After 6pm go to [email or web address] - password [email or web address] [facility] [place] [facility]  [phone]  CC: Primary care physician; Einar Pheasant, MD

## 2018-03-29 NOTE — Care Management (Signed)
patient fell ill while on a cruise.  ID has consulted and pulmonology consult pending. Concern for atypical pneumonia. Room air

## 2018-03-29 NOTE — Progress Notes (Signed)
[place] at [facility]                                                                                                                                                                                  Patient Demographics   Aimee Gomez, is a 62 y.o. female, DOB - 1956-01-28, OJJ:[id number]  Admit date - 03/28/2018   Admitting Physician Amelia Jo, MD  Outpatient Primary MD for the patient is Einar Pheasant, MD   LOS - 1  Subjective: Patient reports that she was on a cruise when her symptoms started. Continuing to have some dry cough Has chest pain on the left side of her chest with deep breathing     Review of Systems:   CONSTITUTIONAL: No documented fever. No fatigue, weakness. No weight gain, no weight loss.  EYES: No blurry or double vision.  ENT: No tinnitus. No postnasal drip. No redness of the oropharynx.  RESPIRATORY: Positive cough, no wheeze, no hemoptysis. No dyspnea.  CARDIOVASCULAR: Positive left chest pain with deep breathing. No orthopnea. No palpitations. No syncope.  GASTROINTESTINAL: No nausea, no vomiting or diarrhea. No abdominal pain. No melena or hematochezia.  GENITOURINARY: No dysuria or hematuria.  ENDOCRINE: No polyuria or nocturia. No heat or cold intolerance.  HEMATOLOGY: No anemia. No bruising. No bleeding.  INTEGUMENTARY: No rashes. No lesions.  MUSCULOSKELETAL: No arthritis. No swelling. No gout.  NEUROLOGIC: No numbness, tingling, or ataxia. No seizure-type activity.  PSYCHIATRIC: No anxiety. No insomnia. No ADD.    Vitals:   Vitals:   03/29/18 0822 03/29/18 1005 03/29/18 1246 03/29/18 1428  BP:  126/61 (!) 105/57   Pulse:  (!) 108 85   Resp:  18 (!) 22   Temp:  (!) 102.5 F (39.2 C) 98.3 F (36.8 C)   TempSrc:  Oral Oral   SpO2: 93% 92% 92% 93%  Weight:      Height:        Wt Readings from Last 3 Encounters:  03/28/18 51.7 kg  03/21/18 51.5 kg  10/30/17 50.5 kg     Intake/Output Summary (Last 24  hours) at 03/29/2018 1445 Last data filed at 03/29/2018 1005 Gross per 24 hour  Intake 334.42 ml  Output -  Net 334.42 ml    Physical Exam:   GENERAL: Pleasant-appearing in no apparent distress.  HEAD, EYES, EARS, NOSE AND THROAT: Atraumatic, normocephalic. Extraocular muscles are intact. Pupils equal and reactive to light. Sclerae anicteric. No conjunctival injection. No oro-pharyngeal erythema.  NECK: Supple. There is no jugular venous distention. No bruits, no lymphadenopathy, no thyromegaly.  HEART: Regular rate and rhythm,. No murmurs, no rubs, no clicks.  LUNGS: Decreased breath  sounds at the bases with no accessory muscle use ABDOMEN: Soft, flat, nontender, nondistended. Has good bowel sounds. No hepatosplenomegaly appreciated.  EXTREMITIES: No evidence of any cyanosis, clubbing, or peripheral edema.  +2 pedal and radial pulses bilaterally.  NEUROLOGIC: The patient is alert, awake, and oriented x3 with no focal motor or sensory deficits appreciated bilaterally.  SKIN: Moist and warm with no rashes appreciated.  Psych: Not anxious, depressed LN: No inguinal LN enlargement    Antibiotics   Anti-infectives (From admission, onward)   Start     Dose/Rate Route Frequency Ordered Stop   03/28/18 2300  cefTRIAXone (ROCEPHIN) 2 g in sodium chloride 0.9 % 100 mL IVPB     2 g 200 mL/hr over 30 Minutes Intravenous Every 24 hours 03/28/18 2246     03/28/18 2300  azithromycin (ZITHROMAX) 500 mg in sodium chloride 0.9 % 250 mL IVPB     500 mg 250 mL/hr over 60 Minutes Intravenous Every 24 hours 03/28/18 2246        Medications   Scheduled Meds: . docusate sodium  100 mg Oral BID  . enoxaparin (LOVENOX) injection  40 mg Subcutaneous Q24H  . fluticasone  1 spray Each Nare Daily  . ibuprofen  400 mg Oral BID  . ipratropium-albuterol  3 mL Nebulization Q6H   Continuous Infusions: . azithromycin Stopped (03/29/18 0058)  . cefTRIAXone (ROCEPHIN)  IV Stopped (03/28/18 2332)   PRN  Meds:.acetaminophen **OR** acetaminophen, bisacodyl, fentaNYL (SUBLIMAZE) injection, HYDROcodone-acetaminophen, ondansetron **OR** ondansetron (ZOFRAN) IV, traZODone   Data Review:   Micro Results No results found for this or any previous visit (from the past 240 hour(s)).  Radiology Reports Dg Chest 2 View  Result Date: 03/28/2018 CLINICAL DATA:  Chest pain EXAM: CHEST - 2 VIEW COMPARISON:  None. FINDINGS: Small bilateral pleural effusions, left greater than right. Airspace disease at the left base. Possible 1 cm nodule in the right lower lung. Normal heart size. No pneumothorax. IMPRESSION: 1. Small left greater than right pleural effusions with left basilar atelectasis or pneumonia 2. Possible 1 cm right lower lung nodule. Suggest chest CT to further evaluate. Electronically Signed   By: Donavan Foil M.D.   On: 03/28/2018 21:10   Ct Angio Chest Pe W And/or Wo Contrast  Result Date: 03/28/2018 CLINICAL DATA:  Dual constant chest pain. EXAM: CT ANGIOGRAPHY CHEST WITH CONTRAST TECHNIQUE: Multidetector CT imaging of the chest was performed using the standard protocol during bolus administration of intravenous contrast. Multiplanar CT image reconstructions and MIPs were obtained to evaluate the vascular anatomy. CONTRAST:  37mL OMNIPAQUE IOHEXOL 350 MG/ML SOLN COMPARISON:  Chest x-ray March 28, 2018 FINDINGS: Cardiovascular: No thoracic aortic aneurysm or dissection. The heart size is normal. No pulmonary emboli. Mediastinum/Nodes: Small bilateral pleural effusions, left greater than right. No pericardial effusion. There is a small hiatal hernia. The thyroid is unremarkable. No adenopathy. Lungs/Pleura: Central airways are normal. No pneumothorax. Patchy bilateral pulmonary infiltrates primarily in the right upper lobe, right middle lobe, and bilateral lower lobes. There are nodular components with the largest nodule measuring 3.5 x 2.1 cm in the right base on axial image 48. This correlates with  the recent chest x-ray finding. More confluent opacity is seen in the left base and more patchy opacity is seen in the right upper lobe with a 9 mm nodular component on image 33. More mild involvement of the left upper lobe is identified. Upper Abdomen: No acute abnormality. Musculoskeletal: No chest wall abnormality. No acute or significant osseous  findings. Review of the MIP images confirms the above findings. IMPRESSION: 1. Patchy bilateral pulmonary infiltrates. The findings are most consistent with an infectious process. However, there are nodular components, with the largest in the right base measuring 3.5 x 2.1 cm. Recommend treatment for infection with a short-term follow-up in approximately 1-2 months to ensure resolution. 2. Small bilateral pleural effusions. 3. Small hiatal hernia. Electronically Signed   By: Dorise Bullion III M.D   On: 03/28/2018 21:54     CBC Recent Labs  Lab 03/28/18 1739 03/29/18 0048  WBC 10.9* 10.1  HGB 12.1 11.5*  HCT 37.0 35.4*  PLT 228 203  MCV 91.4 91.5  MCH 29.9 29.7  MCHC 32.7 32.5  RDW 13.3 13.4    Chemistries  Recent Labs  Lab 03/28/18 1739 03/29/18 0048  NA 135 134*  K 3.6 3.6  CL 100 99  CO2 27 26  GLUCOSE 147* 125*  BUN 21 15  CREATININE 1.01* 0.83  CALCIUM 8.7* 8.1*  AST 22  --   ALT 19  --   ALKPHOS 57  --   BILITOT 0.6  --    ------------------------------------------------------------------------------------------------------------------ estimated creatinine clearance is 56.6 mL/min (by C-G formula based on SCr of 0.83 mg/dL). ------------------------------------------------------------------------------------------------------------------ No results for input(s): HGBA1C in the last 72 hours. ------------------------------------------------------------------------------------------------------------------ No results for input(s): CHOL, HDL, LDLCALC, TRIG, CHOLHDL, LDLDIRECT in the last 72  hours. ------------------------------------------------------------------------------------------------------------------ No results for input(s): TSH, T4TOTAL, T3FREE, THYROIDAB in the last 72 hours.  Invalid input(s): FREET3 ------------------------------------------------------------------------------------------------------------------ No results for input(s): VITAMINB12, FOLATE, FERRITIN, TIBC, IRON, RETICCTPCT in the last 72 hours.  Coagulation profile No results for input(s): INR, PROTIME in the last 168 hours.  No results for input(s): DDIMER in the last 72 hours.  Cardiac Enzymes Recent Labs  Lab 03/28/18 1739  TROPONINI <0.03   ------------------------------------------------------------------------------------------------------------------ Invalid input(s): POCBNP    Assessment & Plan   1. CAP. ,  Possible atypical pneumonia due to recent travel, I discussed with infectious disease physician regarding patient's case.  She recommends checking respiratory viral panel, influenza test nares for MRSA, Legionella in the urine  Continue IV antibiotics with Rocephin and azithromycin ID Dr. will see the patient  2.  Hypertension, stable, resume home medications.      Code Status Orders  (From admission, onward)         Start     Ordered   03/29/18 0039  Full code  Continuous     03/29/18 0038        Code Status History    This patient has a current code status but no historical code status.           Consults  ID  DVT Prophylaxis  Lovenox    Lab Results  Component Value Date   PLT 203 03/29/2018     Time Spent in minutes   52min Greater than 50% of time spent in care coordination and counseling patient regarding the condition and plan of care.   Dustin Flock M.D on 03/29/2018 at 2:45 PM  Between 7am to 6pm - Pager - [phone]  After 6pm go to [email or web address] - Proofreader  [employer]   Office  [phone]

## 2018-03-29 NOTE — ED Notes (Addendum)
Report called to RN pt admitted to 43

## 2018-03-29 NOTE — Progress Notes (Signed)
Patient stated she just received a treatment in the ED (at 2345) and wants to wait till next scheduled treatment at 0800. Patient resting comfortably in bed on room air.

## 2018-03-29 NOTE — Telephone Encounter (Signed)
Pt currently admitted to hospital 

## 2018-03-29 NOTE — Consult Note (Signed)
NAME: Aimee Gomez  DOB: January 08, 1956  MRN: [id number]  Date/Time: 03/29/2018 12:35 PM  Dr. Posey Pronto Subjective:  REASON FOR CONSULT: atypical pneumonia ? Aimee Gomez is a 62 y.o. female with a history of hypertension presents with chills of 3 days duration, and chest pain on the left side of 1 day duration Patient is very active at baseline and has never been admitted to the hospital since 1993.  She presents with left-sided chest pain radiating down from the breast to the left hip and worse with movement that started 1 day duration and chills that started on Monday along with nausea some nasal congestion and postnasal drip. Patient was well of week ago.  She went to her PCP on October 17 for a routine checkup and was fine.  On October 18 she took a flight to go to [place] to catch her cruise.  She stayed in a [place] type of a hotel on Friday the 18th.  She [place] cruise on Saturday around noon stop she was feeling well then.  On Sunday they went to [facility].  She walked in [place] a lot and also did a trolley tour.  She did not swim or snorkel.  She had a lobster roll in the evening.  And came back to the ship that night.  On Monday the ship was heading to [place].  That evening in the ship around 9 PM after dinner she had severe chills and had  to go to her cabin.  She took an Advil.  The next day she did a walking tour in [place].  This walking tour was a rum tour.  she did not do anything else which involved animals or white water rafting or swimming snorkeling or exploring caves or old buildings. At night she again had severe chills.  She had traveled with her friend who is an Therapist, sports.  But did not have a thermometer to check a temperature.  She felt that she was having some postnasal drip and thought she had a head cold.  She did not have any sore throat or cough.  On Wednesday early morning at 1:30 AM she woke up feeling sick and vomited once.  At that day she took the flight back to  [place] from [facility].  When her husband picked her up at 5 PM that evening while coming home the car she was feeling sick again and vomited once.  She went to bed but the next day she had pain in the left side of the chest under the breast going to her hip.  She went to the [facility].  Had a flu test which was negative.  And was sent to the emergency department.  In the ED her temperature was 98.7 blood pressure was 127/57 heart rate was 93 and respiratory rate was 18.She had a white count of 10.1, Hb of 11.5.  Creatinine of 1.01.  She had a chest x-ray which showed small bilateral pleural effusion left greater than right and airspace disease of the left base.  She then had a CTA which showed patchy bilateral pulmonary infiltrates primarily in the right upper lobe, right middle lobe and bilateral lower lobes there are nodular components with the largest nodule measuring 3.5 to 2.1 cm in the right base on axial image 48 this correlates with recent chest x-ray finding more confluent opacity seen in the left base and more patchy opacities in the right upper lobe.  She was started on IV  ceftriaxone and IV azithromycin.  I am asked to see her for this atypical pneumonia. She had a temperature of 102.5 early this morning around 10 AM. She is otherwise feeling a little better.  No cough. Before she left [place] the last weekend she had gone to [facility] where she owns a home and she spent time there.  She did not go to the beach.  Her last travel was to [place] in September.  She does not remember having any sick contacts.  She basically goes to the gym 3 times a week for yoga class and also does some balance exercises.  She is an avid walker.  She does not recollect any tick bite or insect bites.  She had a flu vaccine a month ago. she has not received her pneumococcal vaccine yet as she is only 32.  No recent antibiotic use.  She did consume lobster roll when she was in [facility]  and that may have had raw fish.  Otherwise the  food has not been anything out of the ordinary. Past Medical History:  Diagnosis Date  . Allergy   . Colon polyps   . Hearing loss, sensorineural 1993  . HSV-1 (herpes simplex virus 1) infection   . Hypertension   . Osteopenia   . Postmenopausal     Past Surgical History:  Procedure Laterality Date  . BREAST BIOPSY  1977    Social history Retired Lives with her husband No pets Non-smoker Occasional alcohol Family History  Problem Relation Age of Onset  . Heart disease Father        CHF  . Diabetes Father    No Known Allergies  ? Current Facility-Administered Medications  Medication Dose Route Frequency Provider Last Rate Last Dose  . acetaminophen (TYLENOL) tablet 650 mg  650 mg Oral Q6H PRN Amelia Jo, MD   650 mg at 03/29/18 1007   Or  . acetaminophen (TYLENOL) suppository 650 mg  650 mg Rectal Q6H PRN Amelia Jo, MD      . azithromycin (ZITHROMAX) 500 mg in sodium chloride 0.9 % 250 mL IVPB  500 mg Intravenous Q24H Amelia Jo, MD   Stopped at 03/29/18 9242  . bisacodyl (DULCOLAX) EC tablet 5 mg  5 mg Oral Daily PRN Amelia Jo, MD      . cefTRIAXone (ROCEPHIN) 2 g in sodium chloride 0.9 % 100 mL IVPB  2 g Intravenous Q24H Amelia Jo, MD   Stopped at 03/28/18 2332  . docusate sodium (COLACE) capsule 100 mg  100 mg Oral BID Amelia Jo, MD   100 mg at 03/29/18 0955  . enoxaparin (LOVENOX) injection 40 mg  40 mg Subcutaneous Q24H Amelia Jo, MD      . fentaNYL (SUBLIMAZE) injection 50 mcg  50 mcg Intravenous Q1H PRN Merlyn Lot, MD   50 mcg at 03/28/18 2127  . fluticasone (FLONASE) 50 MCG/ACT nasal spray 1 spray  1 spray Each Nare Daily Amelia Jo, MD   1 spray at 03/29/18 1018  . HYDROcodone-acetaminophen (NORCO/VICODIN) 5-325 MG per tablet 1-2 tablet  1-2 tablet Oral Q4H PRN Amelia Jo, MD   1 tablet at 03/29/18 0954  . ibuprofen (ADVIL,MOTRIN) tablet 400 mg  400 mg Oral BID Dustin Flock, MD       . ipratropium-albuterol (DUONEB) 0.5-2.5 (3) MG/3ML nebulizer solution 3 mL  3 mL Nebulization Q6H Amelia Jo, MD   3 mL at 03/29/18 0821  . ondansetron (ZOFRAN) tablet 4 mg  4  mg Oral Q6H PRN Amelia Jo, MD       Or  . ondansetron [facility]) injection 4 mg  4 mg Intravenous Q6H PRN Amelia Jo, MD      . traZODone (DESYREL) tablet 25 mg  25 mg Oral QHS PRN Amelia Jo, MD         Abtx:  Anti-infectives (From admission, onward)   Start     Dose/Rate Route Frequency Ordered Stop   03/28/18 2300  cefTRIAXone (ROCEPHIN) 2 g in sodium chloride 0.9 % 100 mL IVPB     2 g 200 mL/hr over 30 Minutes Intravenous Every 24 hours 03/28/18 2246     03/28/18 2300  azithromycin (ZITHROMAX) 500 mg in sodium chloride 0.9 % 250 mL IVPB     500 mg 250 mL/hr over 60 Minutes Intravenous Every 24 hours 03/28/18 2246        REVIEW OF SYSTEMS:  Const:  fever, chills, negative weight loss Eyes: negative diplopia or visual changes, negative eye pain ENT: negative coryza, negative sore throat, has postnasal drip and some nasal congestion Resp: negative cough, hemoptysis, dyspnea Cards: negative for chest pain, palpitations, lower extremity edema GU: negative for frequency, dysuria and hematuria GI: Negative for abdominal apin, diarrhea, bleeding, constipation Skin: negative for rash and pruritus Heme: negative for easy bruising and gum/nose bleeding MS: negative for myalgias, arthralgias, back pain and muscle weakness Neurolo:negative for headaches, dizziness, vertigo, memory problems  Psych: negative for feelings of anxiety, depression  Endocrine: negative for thyroid, diabetes Allergy/Immunology- negative for any medication or food allergies ? Pertinent Positives include : Objective:  VITALS:  BP 126/61 (BP Location: Right Arm)   Pulse (!) 108   Temp (!) 102.5 F (39.2 C) (Oral)   Resp 18   Ht 5\' 3"  (1.6 m)   Wt 51.7 kg   LMP 07/26/2003   SpO2 92%   BMI 20.19 kg/m  PHYSICAL EXAM:   General: Alert, cooperative, no distress, appears stated age.  Head: Normocephalic, without obvious abnormality, atraumatic. Eyes: Conjunctivae clear, anicteric sclerae. Pupils are equal ENT Nares normal. No drainage or sinus tenderness. Lips, mucosa, and tongue normal. No Thrush Neck: Supple, symmetrical, no adenopathy, thyroid: non tender no carotid bruit and no JVD. Back: No CVA tenderness. Lungs: Bilateral air entry.  Decreased left base. Heart: Regular rate and rhythm, no murmur, rub or gallop. Abdomen: Soft, non-tender,not distended. Bowel sounds normal. No masses Extremities: atraumatic, no cyanosis. No edema. No clubbing Skin: Tanned, no rashes or lesions. Or bruising Lymph: Cervical, supraclavicular normal. Neurologic: Grossly non-focal Pertinent Labs CBC Latest Ref Rng & Units 03/29/2018 03/28/2018 02/01/2017  WBC 4.0 - 10.5 K/uL 10.1 10.9(H) 5.4  Hemoglobin 12.0 - 15.0 g/dL 11.5(L) 12.1 13.4  Hematocrit 36.0 - 46.0 % 35.4(L) 37.0 40.5  Platelets 150 - 400 K/uL 203 228 243.0   CMP Latest Ref Rng & Units 03/29/2018 03/28/2018 10/30/2017  Glucose 70 - 99 mg/dL 125(H) 147(H) 93  BUN 8 - 23 mg/dL 15 21 22   Creatinine 0.44 - 1.00 mg/dL 0.83 1.01(H) 0.82  Sodium 135 - 145 mmol/L 134(L) 135 140  Potassium 3.5 - 5.1 mmol/L 3.6 3.6 4.3  Chloride 98 - 111 mmol/L 99 100 101  CO2 22 - 32 mmol/L 26 27 32  Calcium 8.9 - 10.3 mg/dL 8.1(L) 8.7(L) 9.7  Total Protein 6.5 - 8.1 g/dL - 6.6 -  Total Bilirubin 0.3 - 1.2 mg/dL - 0.6 -  Alkaline Phos 38 - 126 U/L - 57 -  AST 15 -  41 U/L - 22 -  ALT 0 - 44 U/L - 19 -   IMAGING RESULTS: ? Impression/Recommendation 62 y.o. active and healthy female with a history of hypertension presents with chills of 3 days duration, and chest pain on the left side of 1 day duration  Fever and chills with bilateral nodular infiltrate and bilateral pleural effusion.  Concern for atypical pneumonia.  She felt ill 48 hours into her cruise.  So not sure  whether that could have been a source but it is possible possible for a viral infection.  She also stayed in a hotel in [place].  So atypical pneumonia like Legionella or mycoplasma was as wide as this top of the differential diagnosis. Streptococcus pneumonia or other bacterial pneumonia is unlikely.  She has not been long enough and not saw to think of coccidiomycosis or other or fungal infection.  Also she has not had any risk for Coxiella, tularemia or leptospirosis.  She is not septic.   She has normal kidney and liver function.  No thrombocytopenia.  She is currently on azithromycin and ceftriaxone. Will send respiratory viral PCR, urine Legionella, urine for Streptococcus pneumoniae, and also a blood culture if she has fever again.  Influenza has been negative ? __Hypertension borderline used to be on lisinopril.  History of sensorineural hearing loss on the left side _________________________________________________ Discussed with patient, in great detail.  Also discussed with her husband and hospitalist.. ID will follow her peripherally this weekend.  call if needed

## 2018-03-30 LAB — GLUCOSE, CAPILLARY: Glucose-Capillary: 96 mg/dL (ref 70–99)

## 2018-03-30 LAB — BASIC METABOLIC PANEL
ANION GAP: 8 (ref 5–15)
BUN: 11 mg/dL (ref 8–23)
CALCIUM: 8.3 mg/dL — AB (ref 8.9–10.3)
CO2: 28 mmol/L (ref 22–32)
Chloride: 101 mmol/L (ref 98–111)
Creatinine, Ser: 0.72 mg/dL (ref 0.44–1.00)
GFR calc Af Amer: >60 mL/min (ref >60)
GLUCOSE: 107 mg/dL — AB (ref 70–99)
Potassium: 3.9 mmol/L (ref 3.5–5.1)
Sodium: 137 mmol/L (ref 135–145)

## 2018-03-30 LAB — CBC
HCT: 33.6 % — ABNORMAL LOW (ref 36.0–46.0)
Hemoglobin: 11.1 g/dL — ABNORMAL LOW (ref 12.0–15.0)
MCH: 30 pg (ref 26.0–34.0)
MCHC: 33 g/dL (ref 30.0–36.0)
MCV: 90.8 fL (ref 80.0–100.0)
NRBC: 0 % (ref 0.0–0.2)
PLATELETS: 248 10*3/uL (ref 150–400)
RBC: 3.7 MIL/uL — ABNORMAL LOW (ref 3.87–5.11)
RDW: 13.7 % (ref 11.5–15.5)
WBC: 12.3 10*3/uL — ABNORMAL HIGH (ref 4.0–10.5)

## 2018-03-30 LAB — LEGIONELLA PNEUMOPHILA SEROGP 1 UR AG: L. PNEUMOPHILA SEROGP 1 UR AG: NEGATIVE

## 2018-03-30 LAB — HIV ANTIBODY (ROUTINE TESTING W REFLEX): HIV SCREEN 4TH GENERATION: NONREACTIVE

## 2018-03-30 MED ORDER — ALBUTEROL SULFATE (2.5 MG/3ML) 0.083% IN NEBU: 2.5000 mg | INHALATION_SOLUTION | RESPIRATORY_TRACT | Status: DC | PRN

## 2018-03-30 MED ORDER — IPRATROPIUM-ALBUTEROL 0.5-2.5 (3) MG/3ML IN SOLN
3.0000 mL | Freq: Three times a day (TID) | RESPIRATORY_TRACT | Status: DC
  Administered 2018-03-30 – 2018-03-31 (×3): 3 mL via RESPIRATORY_TRACT
  Filled 2018-03-30 (×3): qty 3

## 2018-03-30 NOTE — Progress Notes (Signed)
[name], female   DOB: 03-Jul-1955, 62 y.o.   MRN: [id number]  [facility] PROGRESS NOTE  Aimee Gomez PQZ:[id number] DOB: January 01, 1956 DOA: 03/28/2018 PCP: Einar Pheasant, MD  HPI/Subjective: Patient feeling a little bit better today.  Able to take a little deeper breath.  The pain on her left side is a little bit less.  Twitching right side of the face that she has not noticed.  Objective: Vitals:   03/30/18 1433 03/30/18 1443  BP:  117/61  Pulse: 79 89  Resp: 16 20  Temp:  (!) 97.5 F (36.4 C)  SpO2: 95% 98%    Filed Weights   03/28/18 2047 03/28/18 2047 03/30/18 0614  Weight: 51.7 kg 51.7 kg 50.6 kg    ROS: Review of Systems  Constitutional: Positive for fever. Negative for chills.  Eyes: Negative for blurred vision.  Respiratory: Positive for cough and shortness of breath.   Cardiovascular: Positive for chest pain.  Gastrointestinal: Negative for abdominal pain, constipation, diarrhea, nausea and vomiting.  Genitourinary: Negative for dysuria.  Musculoskeletal: Negative for joint pain.  Neurological: Negative for dizziness and headaches.   Exam: Physical Exam  Constitutional: She is oriented to person, place, and time.  HENT:  Nose: No mucosal edema.  Mouth/Throat: No oropharyngeal exudate or posterior oropharyngeal edema.  Eyes: Pupils are equal, round, and reactive to light. Conjunctivae, EOM and lids are normal.  Neck: No JVD present. Carotid bruit is not present. No edema present. No thyroid mass and no thyromegaly present.  Cardiovascular: S1 normal and S2 normal. Exam reveals no gallop.  No murmur heard. Pulses:      Dorsalis pedis pulses are 2+ on the right side, and 2+ on the left side.  Respiratory: No respiratory distress. She has decreased breath sounds in the right lower field and the left lower field. She has no wheezes. She has no rhonchi. She has no rales.  GI: Soft. Bowel sounds are normal. There is no tenderness.   Musculoskeletal:       Right ankle: She exhibits no swelling.       Left ankle: She exhibits no swelling.  Lymphadenopathy:    She has no cervical adenopathy.  Neurological: She is alert and oriented to person, place, and time. No cranial nerve deficit.  Skin: Skin is warm. No rash noted. Nails show no clubbing.  Psychiatric: She has a normal mood and affect.      Data Reviewed: Basic Metabolic Panel: Recent Labs  Lab 03/28/18 1739 03/29/18 0048 03/30/18 0735  NA 135 134* 137  K 3.6 3.6 3.9  CL 100 99 101  CO2 27 26 28   GLUCOSE 147* 125* 107*  BUN 21 15 11   CREATININE 1.01* 0.83 0.72  CALCIUM 8.7* 8.1* 8.3*   Liver Function Tests: Recent Labs  Lab 03/28/18 1739  AST 22  ALT 19  ALKPHOS 57  BILITOT 0.6  PROT 6.6  ALBUMIN 3.3*   Recent Labs  Lab 03/28/18 1739  LIPASE 25   CBC: Recent Labs  Lab 03/28/18 1739 03/29/18 0048 03/30/18 0735  WBC 10.9* 10.1 12.3*  HGB 12.1 11.5* 11.1*  HCT 37.0 35.4* 33.6*  MCV 91.4 91.5 90.8  PLT 228 203 248   Cardiac Enzymes: Recent Labs  Lab 03/28/18 1739  TROPONINI <0.03    CBG: Recent Labs  Lab 03/29/18 0757 03/30/18 0737  GLUCAP 103* 96    Recent Results (from the past 240 hour(s))  Respiratory Panel by PCR     Status:  None   Collection Time: 03/29/18 11:47 AM  Result Value Ref Range Status   Adenovirus NOT DETECTED NOT DETECTED Final   Coronavirus 229E NOT DETECTED NOT DETECTED Final   Coronavirus HKU1 NOT DETECTED NOT DETECTED Final   Coronavirus NL63 NOT DETECTED NOT DETECTED Final   Coronavirus OC43 NOT DETECTED NOT DETECTED Final   Metapneumovirus NOT DETECTED NOT DETECTED Final   Rhinovirus / Enterovirus NOT DETECTED NOT DETECTED Final   Influenza A NOT DETECTED NOT DETECTED Final   Influenza B NOT DETECTED NOT DETECTED Final   Parainfluenza Virus 1 NOT DETECTED NOT DETECTED Final   Parainfluenza Virus 2 NOT DETECTED NOT DETECTED Final   Parainfluenza Virus 3 NOT DETECTED NOT DETECTED Final    Parainfluenza Virus 4 NOT DETECTED NOT DETECTED Final   Respiratory Syncytial Virus NOT DETECTED NOT DETECTED Final   Bordetella pertussis NOT DETECTED NOT DETECTED Final   Chlamydophila pneumoniae NOT DETECTED NOT DETECTED Final   Mycoplasma pneumoniae NOT DETECTED NOT DETECTED Final    Comment: Performed at [facility], [place] [address] [place], [place] [address]  CULTURE, BLOOD (ROUTINE X 2) w Reflex to ID Panel     Status: None (Preliminary result)   Collection Time: 03/29/18  5:37 PM  Result Value Ref Range Status   Specimen Description BLOOD RT [facility]  Final   Special Requests   Final    BOTTLES DRAWN AEROBIC AND ANAEROBIC Blood Culture results may not be optimal due to an inadequate volume of blood received in culture bottles   Culture   Final    NO GROWTH < 12 HOURS Performed at [facility], [address], [place], [place] [address]    Report Status PENDING  Incomplete  CULTURE, BLOOD (ROUTINE X 2) w Reflex to ID Panel     Status: None (Preliminary result)   Collection Time: 03/29/18  5:37 PM  Result Value Ref Range Status   Specimen Description BLOOD LT [facility]  Final   Special Requests   Final    BOTTLES DRAWN AEROBIC AND ANAEROBIC Blood Culture results may not be optimal due to an inadequate volume of blood received in culture bottles   Culture   Final    NO GROWTH < 12 HOURS Performed at [facility], [address] [place], [place] [address]    Report Status PENDING  Incomplete     Studies: Dg Chest 2 View  Result Date: 03/28/2018 CLINICAL DATA:  Chest pain EXAM: CHEST - 2 VIEW COMPARISON:  None. FINDINGS: Small bilateral pleural effusions, left greater than right. Airspace disease at the left base. Possible 1 cm nodule in the right lower lung. Normal heart size. No pneumothorax. IMPRESSION: 1. Small left greater than right pleural effusions with left basilar atelectasis or pneumonia 2. Possible 1 cm right lower lung nodule. Suggest chest CT to  further evaluate. Electronically Signed   By: Donavan Foil M.D.   On: 03/28/2018 21:10   Ct Angio Chest Pe W And/or Wo Contrast  Result Date: 03/28/2018 CLINICAL DATA:  Dual constant chest pain. EXAM: CT ANGIOGRAPHY CHEST WITH CONTRAST TECHNIQUE: Multidetector CT imaging of the chest was performed using the standard protocol during bolus administration of intravenous contrast. Multiplanar CT image reconstructions and MIPs were obtained to evaluate the vascular anatomy. CONTRAST:  51mL OMNIPAQUE IOHEXOL 350 MG/ML SOLN COMPARISON:  Chest x-ray March 28, 2018 FINDINGS: Cardiovascular: No thoracic aortic aneurysm or dissection. The heart size is normal. No pulmonary emboli. Mediastinum/Nodes: Small bilateral pleural  effusions, left greater than right. No pericardial effusion. There is a small hiatal hernia. The thyroid is unremarkable. No adenopathy. Lungs/Pleura: Central airways are normal. No pneumothorax. Patchy bilateral pulmonary infiltrates primarily in the right upper lobe, right middle lobe, and bilateral lower lobes. There are nodular components with the largest nodule measuring 3.5 x 2.1 cm in the right base on axial image 48. This correlates with the recent chest x-ray finding. More confluent opacity is seen in the left base and more patchy opacity is seen in the right upper lobe with a 9 mm nodular component on image 33. More mild involvement of the left upper lobe is identified. Upper Abdomen: No acute abnormality. Musculoskeletal: No chest wall abnormality. No acute or significant osseous findings. Review of the MIP images confirms the above findings. IMPRESSION: 1. Patchy bilateral pulmonary infiltrates. The findings are most consistent with an infectious process. However, there are nodular components, with the largest in the right base measuring 3.5 x 2.1 cm. Recommend treatment for infection with a short-term follow-up in approximately 1-2 months to ensure resolution. 2. Small bilateral pleural  effusions. 3. Small hiatal hernia. Electronically Signed   By: Dorise Bullion III M.D   On: 03/28/2018 21:54    Scheduled Meds: . docusate sodium  100 mg Oral BID  . enoxaparin (LOVENOX) injection  40 mg Subcutaneous Q24H  . fluticasone  1 spray Each Nare Daily  . ibuprofen  400 mg Oral BID  . ipratropium-albuterol  3 mL Nebulization TID   Continuous Infusions: . azithromycin Stopped (03/30/18 0034)  . cefTRIAXone (ROCEPHIN)  IV Stopped (03/29/18 2252)    Assessment/Plan:  1. Community-acquired pneumonia, potentially atypical with recent travel out of the country.  Bilateral small pleural effusion.  So far work-up negative.  Empiric Rocephin and Zithromax.  Appreciate ID consultation. 2. History of hypertension.  Blood pressure stable off medication 3. Twitching of the face.  Patient states that this happens sometimes when she is sick 4. Impaired fasting glucose.  Code Status:     Code Status Orders  (From admission, onward)         Start     Ordered   03/29/18 0039  Full code  Continuous     03/29/18 0038        Code Status History    This patient has a current code status but no historical code status.     Antibiotics:  Rocephin  Zithromax  Time spent: 28 minutes  [employer]

## 2018-03-30 NOTE — Progress Notes (Signed)
Temp increase to 101.6 with relief with tylenol, norco times one for left flank pain with relief. VSS, breathing tx's and iv antibiotics. MRSA Cx results pending, continue on isolation. Continue to monitor.

## 2018-03-30 NOTE — Progress Notes (Signed)
ISD education complete. Pt understands technique and reason for use. Pt inspire 5108ml+-. Pt tol fairly well with pain in left side from fluid. Pt independent with use

## 2018-03-30 NOTE — Plan of Care (Signed)
  Problem: Activity: Goal: Ability to tolerate increased activity will improve Outcome: Progressing   Problem: Clinical Measurements: Goal: Ability to maintain a body temperature in the normal range will improve Outcome: Progressing   Problem: Respiratory: Goal: Ability to maintain adequate ventilation will improve Outcome: Progressing Goal: Ability to maintain a clear airway will improve Outcome: Progressing   Problem: Pain Managment: Goal: General experience of comfort will improve Outcome: Progressing   Problem: Safety: Goal: Ability to remain free from injury will improve Outcome: Progressing   Problem: Skin Integrity: Goal: Risk for impaired skin integrity will decrease Outcome: Progressing

## 2018-03-30 NOTE — Plan of Care (Signed)
  Problem: Activity: Goal: Ability to tolerate increased activity will improve 03/30/2018 1444 by Herbie Baltimore, RN Outcome: Progressing 03/30/2018 1439 by Herbie Baltimore, RN Outcome: Progressing   Problem: Clinical Measurements: Goal: Ability to maintain a body temperature in the normal range will improve 03/30/2018 1444 by Herbie Baltimore, RN Outcome: Progressing 03/30/2018 1439 by Herbie Baltimore, RN Outcome: Progressing   Problem: Respiratory: Goal: Ability to maintain adequate ventilation will improve 03/30/2018 1444 by Herbie Baltimore, RN Outcome: Progressing 03/30/2018 1439 by Herbie Baltimore, RN Outcome: Progressing Goal: Ability to maintain a clear airway will improve 03/30/2018 1444 by Herbie Baltimore, RN Outcome: Progressing 03/30/2018 1439 by Herbie Baltimore, RN Outcome: Progressing   Problem: Pain Managment: Goal: General experience of comfort will improve 03/30/2018 1444 by Herbie Baltimore, RN Outcome: Progressing 03/30/2018 1439 by Herbie Baltimore, RN Outcome: Progressing   Problem: Safety: Goal: Ability to remain free from injury will improve 03/30/2018 1444 by Herbie Baltimore, RN Outcome: Progressing 03/30/2018 1439 by Herbie Baltimore, RN Outcome: Progressing   Problem: Skin Integrity: Goal: Risk for impaired skin integrity will decrease 03/30/2018 1444 by Herbie Baltimore, RN Outcome: Progressing 03/30/2018 1439 by Herbie Baltimore, RN Outcome: Progressing

## 2018-03-31 LAB — MRSA CULTURE: Culture: NEGATIVE

## 2018-03-31 LAB — GLUCOSE, CAPILLARY: Glucose-Capillary: 78 mg/dL (ref 70–99)

## 2018-03-31 MED ORDER — BENZONATATE 100 MG PO CAPS
200.0000 mg | ORAL_CAPSULE | Freq: Three times a day (TID) | ORAL | Status: DC | PRN
  Administered 2018-03-31: 200 mg via ORAL
  Filled 2018-03-31 (×2): qty 2

## 2018-03-31 MED ORDER — IPRATROPIUM-ALBUTEROL 0.5-2.5 (3) MG/3ML IN SOLN: 3.0000 mL | RESPIRATORY_TRACT | Status: DC | PRN

## 2018-03-31 MED ORDER — GUAIFENESIN-DM 100-10 MG/5ML PO SYRP
5.0000 mL | ORAL_SOLUTION | ORAL | Status: DC | PRN
  Administered 2018-03-31: 5 mL via ORAL
  Filled 2018-03-31 (×2): qty 5

## 2018-03-31 NOTE — Plan of Care (Signed)
  Problem: Activity: Goal: Ability to tolerate increased activity will improve Outcome: Progressing   Problem: Clinical Measurements: Goal: Ability to maintain a body temperature in the normal range will improve Outcome: Progressing   Problem: Respiratory: Goal: Ability to maintain adequate ventilation will improve Outcome: Progressing Goal: Ability to maintain a clear airway will improve Outcome: Progressing   Problem: Pain Managment: Goal: General experience of comfort will improve Outcome: Progressing   Problem: Safety: Goal: Ability to remain free from injury will improve Outcome: Progressing   Problem: Skin Integrity: Goal: Risk for impaired skin integrity will decrease Outcome: Progressing

## 2018-03-31 NOTE — Progress Notes (Signed)
Received verbal order from Dr. Leslye Peer to discontinue droplet and contact precautions. Respiratory PCR negative.

## 2018-03-31 NOTE — Progress Notes (Signed)
[name], female   DOB: [birth year], 63 y.o.   MRN: [id number]  [facility] PROGRESS NOTE  Aimee Gomez TWS:[id number] DOB: August 06, 1955 DOA: 03/28/2018 PCP: Einar Pheasant, MD  HPI/Subjective: Patient feeling a little bit better today.  Pain on the left side is decreased.  Still having low-grade fever.  Some cough.  Objective: Vitals:   03/31/18 0805 03/31/18 1051  BP:  127/72  Pulse: 94 (!) 104  Resp: 16 (!) 24  Temp:  (!) 100.9 F (38.3 C)  SpO2: 93% 92%    Filed Weights   03/28/18 2047 03/30/18 0614 03/31/18 0513  Weight: 51.7 kg 50.6 kg 52.3 kg    ROS: Review of Systems  Constitutional: Positive for fever. Negative for chills.  Eyes: Negative for blurred vision.  Respiratory: Positive for cough and shortness of breath.   Cardiovascular: Positive for chest pain.  Gastrointestinal: Negative for abdominal pain, constipation, diarrhea, nausea and vomiting.  Genitourinary: Negative for dysuria.  Musculoskeletal: Negative for joint pain.  Neurological: Negative for dizziness and headaches.   Exam: Physical Exam  Constitutional: She is oriented to person, place, and time.  HENT:  Nose: No mucosal edema.  Mouth/Throat: No oropharyngeal exudate or posterior oropharyngeal edema.  Eyes: Pupils are equal, round, and reactive to light. Conjunctivae, EOM and lids are normal.  Neck: No JVD present. Carotid bruit is not present. No edema present. No thyroid mass and no thyromegaly present.  Cardiovascular: S1 normal and S2 normal. Exam reveals no gallop.  No murmur heard. Pulses:      Dorsalis pedis pulses are 2+ on the right side, and 2+ on the left side.  Respiratory: No respiratory distress. She has decreased breath sounds in the right lower field and the left lower field. She has no wheezes. She has no rhonchi. She has no rales.  GI: Soft. Bowel sounds are normal. There is no tenderness.  Musculoskeletal:       Right ankle: She exhibits no swelling.        Left ankle: She exhibits no swelling.  Lymphadenopathy:    She has no cervical adenopathy.  Neurological: She is alert and oriented to person, place, and time. No cranial nerve deficit.  Skin: Skin is warm. No rash noted. Nails show no clubbing.  Psychiatric: She has a normal mood and affect.      Data Reviewed: Basic Metabolic Panel: Recent Labs  Lab 03/28/18 1739 03/29/18 0048 03/30/18 0735  NA 135 134* 137  K 3.6 3.6 3.9  CL 100 99 101  CO2 27 26 28   GLUCOSE 147* 125* 107*  BUN 21 15 11   CREATININE 1.01* 0.83 0.72  CALCIUM 8.7* 8.1* 8.3*   Liver Function Tests: Recent Labs  Lab 03/28/18 1739  AST 22  ALT 19  ALKPHOS 57  BILITOT 0.6  PROT 6.6  ALBUMIN 3.3*   Recent Labs  Lab 03/28/18 1739  LIPASE 25   CBC: Recent Labs  Lab 03/28/18 1739 03/29/18 0048 03/30/18 0735  WBC 10.9* 10.1 12.3*  HGB 12.1 11.5* 11.1*  HCT 37.0 35.4* 33.6*  MCV 91.4 91.5 90.8  PLT 228 203 248   Cardiac Enzymes: Recent Labs  Lab 03/28/18 1739  TROPONINI <0.03    CBG: Recent Labs  Lab 03/29/18 0757 03/30/18 0737 03/31/18 0748  GLUCAP 103* 96 78    Recent Results (from the past 240 hour(s))  Respiratory Panel by PCR     Status: None   Collection Time: 03/29/18 11:47 AM  Result Value  Ref Range Status   Adenovirus NOT DETECTED NOT DETECTED Final   Coronavirus 229E NOT DETECTED NOT DETECTED Final   Coronavirus HKU1 NOT DETECTED NOT DETECTED Final   Coronavirus NL63 NOT DETECTED NOT DETECTED Final   Coronavirus OC43 NOT DETECTED NOT DETECTED Final   Metapneumovirus NOT DETECTED NOT DETECTED Final   Rhinovirus / Enterovirus NOT DETECTED NOT DETECTED Final   Influenza A NOT DETECTED NOT DETECTED Final   Influenza B NOT DETECTED NOT DETECTED Final   Parainfluenza Virus 1 NOT DETECTED NOT DETECTED Final   Parainfluenza Virus 2 NOT DETECTED NOT DETECTED Final   Parainfluenza Virus 3 NOT DETECTED NOT DETECTED Final   Parainfluenza Virus 4 NOT DETECTED NOT DETECTED Final    Respiratory Syncytial Virus NOT DETECTED NOT DETECTED Final   Bordetella pertussis NOT DETECTED NOT DETECTED Final   Chlamydophila pneumoniae NOT DETECTED NOT DETECTED Final   Mycoplasma pneumoniae NOT DETECTED NOT DETECTED Final    Comment: Performed at [facility], [place] [address], [place], [place] [address]  MRSA culture     Status: None   Collection Time: 03/29/18 11:47 AM  Result Value Ref Range Status   Specimen Description   Final    FLUID Performed at [facility], [address] [place], [place] [address]    Special Requests   Final    NONE Performed at [facility], [address] [place], [place] [address]    Culture   Final    NEGATIVE Performed at [facility], [place] [address] [place], [place] [address]    Report Status 03/31/2018 FINAL  Final  CULTURE, BLOOD (ROUTINE X 2) w Reflex to ID Panel     Status: None (Preliminary result)   Collection Time: 03/29/18  5:37 PM  Result Value Ref Range Status   Specimen Description BLOOD RT [place]  Final   Special Requests   Final    BOTTLES DRAWN AEROBIC AND ANAEROBIC Blood Culture results may not be optimal due to an inadequate volume of blood received in culture bottles   Culture   Final    NO GROWTH 2 DAYS Performed at [facility], [address] [place], [place] [address]    Report Status PENDING  Incomplete  CULTURE, BLOOD (ROUTINE X 2) w Reflex to ID Panel     Status: None (Preliminary result)   Collection Time: 03/29/18  5:37 PM  Result Value Ref Range Status   Specimen Description BLOOD LT [facility]  Final   Special Requests   Final    BOTTLES DRAWN AEROBIC AND ANAEROBIC Blood Culture results may not be optimal due to an inadequate volume of blood received in culture bottles   Culture   Final    NO GROWTH 2 DAYS Performed at [facility], [address] [place], [place] [address]    Report Status PENDING  Incomplete  CULTURE, BLOOD (ROUTINE X 2) w Reflex to ID Panel      Status: None (Preliminary result)   Collection Time: 03/30/18  9:17 PM  Result Value Ref Range Status   Specimen Description BLOOD RIGHT ANTECUBITAL  Final   Special Requests   Final    BOTTLES DRAWN AEROBIC AND ANAEROBIC Blood Culture results may not be optimal due to an excessive volume of blood received in culture bottles   Culture   Final    NO GROWTH < 12 HOURS Performed at [facility], [address] [place], [place] [address]  Report Status PENDING  Incomplete  CULTURE, BLOOD (ROUTINE X 2) w Reflex to ID Panel     Status: None (Preliminary result)   Collection Time: 03/30/18  9:25 PM  Result Value Ref Range Status   Specimen Description BLOOD LEFT ANTECUBITAL  Final   Special Requests   Final    BOTTLES DRAWN AEROBIC AND ANAEROBIC Blood Culture results may not be optimal due to an excessive volume of blood received in culture bottles   Culture   Final    NO GROWTH < 12 HOURS Performed at [facility], [address] [place], [place] [address]    Report Status PENDING  Incomplete      Scheduled Meds: . docusate sodium  100 mg Oral BID  . enoxaparin (LOVENOX) injection  40 mg Subcutaneous Q24H  . fluticasone  1 spray Each Nare Daily  . ibuprofen  400 mg Oral BID   Continuous Infusions: . azithromycin 500 mg (03/30/18 2352)  . cefTRIAXone (ROCEPHIN)  IV 2 g (03/30/18 2359)    Assessment/Plan:  1. Community-acquired pneumonia, potentially atypical with recent travel out of the country.  Bilateral small pleural effusion.  So far work-up negative.  Empiric Rocephin and Zithromax.  Appreciate ID consultation. 2. History of hypertension.  Blood pressure stable off medication 3. Twitching of the face.  Patient states that this happens sometimes when she is sick 4. Impaired fasting glucose.  Code Status:     Code Status Orders  (From admission, onward)         Start     Ordered   03/29/18 0039  Full code  Continuous     03/29/18 0038         Code Status History    This patient has a current code status but no historical code status.     Antibiotics:  Rocephin  Zithromax  Time spent: 26 minutes  [place]

## 2018-04-01 ENCOUNTER — Inpatient Hospital Stay: Payer: BC Managed Care – PPO

## 2018-04-01 ENCOUNTER — Telehealth: Payer: Self-pay

## 2018-04-01 LAB — COMPREHENSIVE METABOLIC PANEL
ALBUMIN: 2.4 g/dL — AB (ref 3.5–5.0)
ALT: 22 U/L (ref 0–44)
AST: 26 U/L (ref 15–41)
Alkaline Phosphatase: 62 U/L (ref 38–126)
Anion gap: 7 (ref 5–15)
BILIRUBIN TOTAL: 0.2 mg/dL — AB (ref 0.3–1.2)
BUN: 16 mg/dL (ref 8–23)
CO2: 28 mmol/L (ref 22–32)
Calcium: 8.6 mg/dL — ABNORMAL LOW (ref 8.9–10.3)
Chloride: 102 mmol/L (ref 98–111)
Creatinine, Ser: 0.72 mg/dL (ref 0.44–1.00)
GFR calc Af Amer: >60 mL/min (ref >60)
GFR calc non Af Amer: >60 mL/min (ref >60)
GLUCOSE: 118 mg/dL — AB (ref 70–99)
POTASSIUM: 3.3 mmol/L — AB (ref 3.5–5.1)
SODIUM: 137 mmol/L (ref 135–145)
TOTAL PROTEIN: 6 g/dL — AB (ref 6.5–8.1)

## 2018-04-01 LAB — CBC WITH DIFFERENTIAL/PLATELET
ABS IMMATURE GRANULOCYTES: 0.23 10*3/uL — AB (ref 0.00–0.07)
Basophils Absolute: 0 10*3/uL (ref 0.0–0.1)
Basophils Relative: 0 %
Eosinophils Absolute: 0.4 10*3/uL (ref 0.0–0.5)
Eosinophils Relative: 4 %
HEMATOCRIT: 30.8 % — AB (ref 36.0–46.0)
HEMOGLOBIN: 10.2 g/dL — AB (ref 12.0–15.0)
Immature Granulocytes: 2 %
LYMPHS ABS: 1.2 10*3/uL (ref 0.7–4.0)
LYMPHS PCT: 13 %
MCH: 30 pg (ref 26.0–34.0)
MCHC: 33.1 g/dL (ref 30.0–36.0)
MCV: 90.6 fL (ref 80.0–100.0)
MONOS PCT: 8 %
Monocytes Absolute: 0.7 10*3/uL (ref 0.1–1.0)
NEUTROS ABS: 7 10*3/uL (ref 1.7–7.7)
Neutrophils Relative %: 73 %
PLATELETS: 374 10*3/uL (ref 150–400)
RBC: 3.4 MIL/uL — AB (ref 3.87–5.11)
RDW: 13.7 % (ref 11.5–15.5)
WBC: 9.5 10*3/uL (ref 4.0–10.5)
nRBC: 0 % (ref 0.0–0.2)

## 2018-04-01 LAB — GLUCOSE, CAPILLARY: Glucose-Capillary: 78 mg/dL (ref 70–99)

## 2018-04-01 LAB — PROCALCITONIN: Procalcitonin: 0.37 ng/mL

## 2018-04-01 MED ORDER — POLYETHYLENE GLYCOL 3350 17 G PO PACK: 17.0000 g | PACK | Freq: Every day | ORAL | Status: DC

## 2018-04-01 MED ORDER — LEVOFLOXACIN IN D5W 750 MG/150ML IV SOLN
750.0000 mg | INTRAVENOUS | Status: DC
  Administered 2018-04-01: 18:00:00 750 mg via INTRAVENOUS
  Filled 2018-04-01 (×2): qty 150

## 2018-04-01 NOTE — Telephone Encounter (Signed)
Copied from [place] [phone]. Topic: Compliment - Staff >> Apr 01, 2018 10:49 AM Blase Mess A wrote: CRM for notification. See Telephone encounter for: 04/01/18. Date of encounter: 03/26/18 Details of compliment: [place] called to thank Modena Slater or Lattie Haw the nurse.  They gave her good advise. Patient went to the hospital after speaking to them and has nueponia. Who would the patient like to thank/see rewarded? Modena Slater or Lattie Haw  On a scale of 1-10, how was your experience? 10  Route to Engineer, building services.

## 2018-04-01 NOTE — Progress Notes (Signed)
? Aimee Gomez is a 62 y.o. female with a history of hypertension presents with chills of 3 days duration, and chest pain on the left side of 1 day duration Patient is very active at baseline and has never been admitted to the hospital since 1993.  She presents with left-sided chest pain radiating down from the breast to the left hip and worse with movement that started 1 day duration and chills that started on Monday along with nausea some nasal congestion and postnasal drip. Patient was well of week ago.  She went to her PCP on October 17 for a routine checkup and was fine.  On October 18 she took a flight to go to [place] to catch her cruise.  She stayed in a [place] type of a hotel on Friday the 18th.  She [place] on Saturday around noon stop she was feeling well then.  On Sunday they went to [facility].  She walked in [place] a lot and also did a trolley tour.  She did not swim or snorkel.  She had a lobster roll in the evening.  And came back to the ship that night.  On Monday the ship was heading to [place].  That evening in the ship around 9 PM after dinner she had severe chills and had  to go to her cabin.  She took an Advil.  The next day she did a walking tour in [place].  This walking tour was a rum tour.  she did not do anything else which involved animals or white water rafting or swimming snorkeling or exploring caves or old buildings. At night she again had severe chills.  She had traveled with her friend who is an Therapist, sports.  But did not have a thermometer to check a temperature.  She felt that she was having some postnasal drip and thought she had a head cold.  She did not have any sore throat or cough.  On Wednesday early morning at 1:30 AM she woke up feeling sick and vomited once.  At that day she took the flight back to [place] from [facility].  When her husband picked her up at 5 PM that evening while coming home the car she was feeling sick again and vomited once.  She  went to bed but the next day she had pain in the left side of the chest under the breast going to her hip.  She went to the [facility] walk[facility]in clinic.  Had a flu test which was negative.  And was sent to the emergency department.  In the ED her temperature was 98.7 blood pressure was 127/57 heart rate was 93 and respiratory rate was 18.She had a white count of 10.1, Hb of 11.5.  Creatinine of 1.01.  She had a chest x-ray which showed small bilateral pleural effusion left greater than right and airspace disease of the left base.  She then had a CTA which showed patchy bilateral pulmonary infiltrates primarily in the right upper lobe, right middle lobe and bilateral lower lobes there are nodular components with the largest nodule measuring 3.5 to 2.1 cm in the right base on axial image 48 this correlates with recent chest x-ray finding more confluent opacity seen in the left base and more patchy opacities in the right upper lobe.  She was started on IV ceftriaxone and IV azithromycin.  I am asked to see her for this atypical pneumonia. She had a temperature of 102.5 early this morning  around 10 AM. She is otherwise feeling a little better.  No cough. Before she left for the [place] the last weekend she had gone to [facility] where she owns a home and she spent time there.  She did not go to the beach.  Her last travel was to [place] in September.  She does not remember having any sick contacts.  She basically goes to the gym 3 times a week for yoga class and also does some balance exercises.  She is an avid walker.  She does not recollect any tick bite or insect bites.  She had a flu vaccine a month ago. she has not received her pneumococcal vaccine yet as she is only 62.  No recent antibiotic use.  She did consume lobster roll when she was in [facility] and that may have had raw fish.    Subjective Fever persist but curve trending down     Taking tylenol regularly. Was constipated and had bowel movt  today and feels better Appetite better No nausea Objective:  VITALS:  BP (!) 149/84   Pulse 100   Temp 99.6 F (37.6 C) (Oral)   Resp 18   Ht 5\' 3"  (1.6 m)   Wt 52.3 kg   LMP 07/26/2003   SpO2 93%   BMI 20.41 kg/m  PHYSICAL EXAM:  General: Alert, cooperative, no distress,  Head: Normocephalic, without obvious abnormality, atraumatic. Eyes: Conjunctivae clear, anicteric sclerae. Pupils are equal ENT Nares normal. No drainage or sinus tenderness. Lips, mucosa, and tongue normal. No Thrush Neck: Supple, symmetrical, no adenopathy, thyroid: non tender no carotid bruit and no JVD. Back: No CVA tenderness. Lungs: Bilateral air entry.  Bronchial breathing left base-Decreased left base. Heart: Regular rate and rhythm, no murmur, rub or gallop. Abdomen: Soft, non-tender,not distended. Bowel sounds normal. No masses Extremities: atraumatic, no cyanosis. No edema. No clubbing Skin: Tanned, no rashes or lesions. Or bruising Lymph: Cervical, supraclavicular normal. Neurologic: Grossly non-focal Pertinent Labs CBC Latest Ref Rng & Units 03/30/2018 03/29/2018 03/28/2018  WBC 4.0 - 10.5 K/uL 12.3(H) 10.1 10.9(H)  Hemoglobin 12.0 - 15.0 g/dL 11.1(L) 11.5(L) 12.1  Hematocrit 36.0 - 46.0 % 33.6(L) 35.4(L) 37.0  Platelets 150 - 400 K/uL 248 203 228   CMP Latest Ref Rng & Units 03/30/2018 03/29/2018 03/28/2018  Glucose 70 - 99 mg/dL 107(H) 125(H) 147(H)  BUN 8 - 23 mg/dL 11 15 21   Creatinine 0.44 - 1.00 mg/dL 0.72 0.83 1.01(H)  Sodium 135 - 145 mmol/L 137 134(L) 135  Potassium 3.5 - 5.1 mmol/L 3.9 3.6 3.6  Chloride 98 - 111 mmol/L 101 99 100  CO2 22 - 32 mmol/L 28 26 27   Calcium 8.9 - 10.3 mg/dL 8.3(L) 8.1(L) 8.7(L)  Total Protein 6.5 - 8.1 g/dL - - 6.6  Total Bilirubin 0.3 - 1.2 mg/dL - - 0.6  Alkaline Phos 38 - 126 U/L - - 57  AST 15 - 41 U/L - - 22  ALT 0 - 44 U/L - - 19   IMAGING RESULTS: ? Impression/Recommendation 62 y.o. active and healthy female with a history of  hypertension presents with chills of 3 days duration, and chest pain on the left side of 1 day duration  Fever and chills with bilateral nodular infiltrate and bilateral pleural effusion.  Concern for atypical pneumonia.  She fell ill 48 hours into her cruise.  So not sure whether that could have been a source but it is possible possible for a viral infection.  She  also stayed in a hotel in [facility].  So atypical pneumonia like Legionella or mycoplasma in D>D==  Streptococcus pneumonia or other bacterial pneumonia less likely.   coccidiomycosis or other or fungal infection less likely.  Also she has not had any risk for Coxiella, tularemia or leptospirosis.  She is not septic.   She has normal kidney and liver function.  No thrombocytopenia.  She is currently on azithromycin and ceftriaxone. resp viral PCR neg, Urine legionella Neg, strep pneum neg Because of persistent fever will repeat CXR, and also get cbc/CMP/procal and fungal antibodies Change ceftriaxone and zithromax to levaquin ? __Hypertension borderline used to be on lisinopril.  History of sensorineural hearing loss on the left side _________________________________________________ Discussed with patient and hospitalist  in great detail.

## 2018-04-01 NOTE — Plan of Care (Signed)
  Problem: Activity: Goal: Ability to tolerate increased activity will improve Outcome: Progressing   Problem: Clinical Measurements: Goal: Ability to maintain a body temperature in the normal range will improve Outcome: Progressing   Problem: Respiratory: Goal: Ability to maintain adequate ventilation will improve Outcome: Progressing Goal: Ability to maintain a clear airway will improve Outcome: Progressing   Problem: Pain Managment: Goal: General experience of comfort will improve Outcome: Progressing   Problem: Safety: Goal: Ability to remain free from injury will improve Outcome: Progressing   Problem: Skin Integrity: Goal: Risk for impaired skin integrity will decrease Outcome: Progressing

## 2018-04-01 NOTE — Progress Notes (Signed)
[name], female   DOB: 12/10/1955, 62 y.o.   MRN: [id number]  [facility] PROGRESS NOTE  Zilpha Mcandrew RDE:[id number] DOB: 1955/12/08 DOA: 03/28/2018 PCP: Einar Pheasant, MD  HPI/Subjective: Patient having fevers at 8:00 at night.  Feels okay.  Breathing better.  Pain is gone.  Objective: Vitals:   04/01/18 1027 04/01/18 1559  BP: (!) 149/84 132/78  Pulse: 100 83  Resp:  (!) 24  Temp: 99.6 F (37.6 C) 98.3 F (36.8 C)  SpO2: 93% 97%    Filed Weights   03/28/18 2047 03/30/18 0614 03/31/18 0513  Weight: 51.7 kg 50.6 kg 52.3 kg    ROS: Review of Systems  Constitutional: Positive for fever. Negative for chills.  Eyes: Negative for blurred vision.  Respiratory: Positive for shortness of breath. Negative for cough.   Cardiovascular: Negative for chest pain.  Gastrointestinal: Negative for abdominal pain, constipation, diarrhea, nausea and vomiting.  Genitourinary: Negative for dysuria.  Musculoskeletal: Negative for joint pain.  Neurological: Negative for dizziness and headaches.   Exam: Physical Exam  Constitutional: She is oriented to person, place, and time.  HENT:  Nose: No mucosal edema.  Mouth/Throat: No oropharyngeal exudate or posterior oropharyngeal edema.  Eyes: Pupils are equal, round, and reactive to light. Conjunctivae, EOM and lids are normal.  Neck: No JVD present. Carotid bruit is not present. No edema present. No thyroid mass and no thyromegaly present.  Cardiovascular: S1 normal and S2 normal. Exam reveals no gallop.  No murmur heard. Pulses:      Dorsalis pedis pulses are 2+ on the right side, and 2+ on the left side.  Respiratory: No respiratory distress. She has decreased breath sounds in the right lower field and the left lower field. She has no wheezes. She has no rhonchi. She has no rales.  GI: Soft. Bowel sounds are normal. There is no tenderness.  Musculoskeletal:       Right ankle: She exhibits no swelling.       Left ankle:  She exhibits no swelling.  Lymphadenopathy:    She has no cervical adenopathy.  Neurological: She is alert and oriented to person, place, and time. No cranial nerve deficit.  Skin: Skin is warm. No rash noted. Nails show no clubbing.  Psychiatric: She has a normal mood and affect.      Data Reviewed: Basic Metabolic Panel: Recent Labs  Lab 03/28/18 1739 03/29/18 0048 03/30/18 0735 04/01/18 1449  NA 135 134* 137 137  K 3.6 3.6 3.9 3.3*  CL 100 99 101 102  CO2 27 26 28 28   GLUCOSE 147* 125* 107* 118*  BUN 21 15 11 16   CREATININE 1.01* 0.83 0.72 0.72  CALCIUM 8.7* 8.1* 8.3* 8.6*   Liver Function Tests: Recent Labs  Lab 03/28/18 1739 04/01/18 1449  AST 22 26  ALT 19 22  ALKPHOS 57 62  BILITOT 0.6 0.2*  PROT 6.6 6.0*  ALBUMIN 3.3* 2.4*   Recent Labs  Lab 03/28/18 1739  LIPASE 25   CBC: Recent Labs  Lab 03/28/18 1739 03/29/18 0048 03/30/18 0735 04/01/18 1449  WBC 10.9* 10.1 12.3* 9.5  NEUTROABS  --   --   --  7.0  HGB 12.1 11.5* 11.1* 10.2*  HCT 37.0 35.4* 33.6* 30.8*  MCV 91.4 91.5 90.8 90.6  PLT 228 203 248 374   Cardiac Enzymes: Recent Labs  Lab 03/28/18 1739  TROPONINI <0.03    CBG: Recent Labs  Lab 03/29/18 0757 03/30/18 0737 03/31/18 0748 04/01/18 0745  GLUCAP 103* 96 78 78    Recent Results (from the past 240 hour(s))  Respiratory Panel by PCR     Status: None   Collection Time: 03/29/18 11:47 AM  Result Value Ref Range Status   Adenovirus NOT DETECTED NOT DETECTED Final   Coronavirus 229E NOT DETECTED NOT DETECTED Final   Coronavirus HKU1 NOT DETECTED NOT DETECTED Final   Coronavirus NL63 NOT DETECTED NOT DETECTED Final   Coronavirus OC43 NOT DETECTED NOT DETECTED Final   Metapneumovirus NOT DETECTED NOT DETECTED Final   Rhinovirus / Enterovirus NOT DETECTED NOT DETECTED Final   Influenza A NOT DETECTED NOT DETECTED Final   Influenza B NOT DETECTED NOT DETECTED Final   Parainfluenza Virus 1 NOT DETECTED NOT DETECTED Final    Parainfluenza Virus 2 NOT DETECTED NOT DETECTED Final   Parainfluenza Virus 3 NOT DETECTED NOT DETECTED Final   Parainfluenza Virus 4 NOT DETECTED NOT DETECTED Final   Respiratory Syncytial Virus NOT DETECTED NOT DETECTED Final   Bordetella pertussis NOT DETECTED NOT DETECTED Final   Chlamydophila pneumoniae NOT DETECTED NOT DETECTED Final   Mycoplasma pneumoniae NOT DETECTED NOT DETECTED Final    Comment: Performed at [facility], [place] [address], [place], [place] [address]  MRSA culture     Status: None   Collection Time: 03/29/18 11:47 AM  Result Value Ref Range Status   Specimen Description   Final    FLUID Performed at [facility], [address] [place], [place] [address]    Special Requests   Final    NONE Performed at [facility], [address] [place], [place] [address]    Culture   Final    NEGATIVE Performed at [facility], [place] [address] [place], [place] [address]    Report Status 03/31/2018 FINAL  Final  CULTURE, BLOOD (ROUTINE X 2) w Reflex to ID Panel     Status: None (Preliminary result)   Collection Time: 03/29/18  5:37 PM  Result Value Ref Range Status   Specimen Description BLOOD RT [facility]  Final   Special Requests   Final    BOTTLES DRAWN AEROBIC AND ANAEROBIC Blood Culture results may not be optimal due to an inadequate volume of blood received in culture bottles   Culture   Final    NO GROWTH 3 DAYS Performed at [facility], [address], [place], [place] [address]    Report Status PENDING  Incomplete  CULTURE, BLOOD (ROUTINE X 2) w Reflex to ID Panel     Status: None (Preliminary result)   Collection Time: 03/29/18  5:37 PM  Result Value Ref Range Status   Specimen Description BLOOD LT [facility]  Final   Special Requests   Final    BOTTLES DRAWN AEROBIC AND ANAEROBIC Blood Culture results may not be optimal due to an inadequate volume of blood received in culture bottles   Culture   Final    NO GROWTH 3  DAYS Performed at [facility], [address] [place],  [address]    Report Status PENDING  Incomplete  CULTURE, BLOOD (ROUTINE X 2) w Reflex to ID Panel     Status: None (Preliminary result)   Collection Time: 03/30/18  9:17 PM  Result Value Ref Range Status   Specimen Description BLOOD RIGHT ANTECUBITAL  Final   Special Requests   Final    BOTTLES DRAWN AEROBIC AND ANAEROBIC Blood Culture results may not be optimal due to an  excessive volume of blood received in culture bottles   Culture   Final    NO GROWTH 2 DAYS Performed at [facility], [address] [place], [place] [address]    Report Status PENDING  Incomplete  CULTURE, BLOOD (ROUTINE X 2) w Reflex to ID Panel     Status: None (Preliminary result)   Collection Time: 03/30/18  9:25 PM  Result Value Ref Range Status   Specimen Description BLOOD LEFT ANTECUBITAL  Final   Special Requests   Final    BOTTLES DRAWN AEROBIC AND ANAEROBIC Blood Culture results may not be optimal due to an excessive volume of blood received in culture bottles   Culture   Final    NO GROWTH 2 DAYS Performed at [facility], [address], [place], [place] [address]    Report Status PENDING  Incomplete      Scheduled Meds: . docusate sodium  100 mg Oral BID  . enoxaparin (LOVENOX) injection  40 mg Subcutaneous Q24H  . fluticasone  1 spray Each Nare Daily  . ibuprofen  400 mg Oral BID   Continuous Infusions: . levofloxacin (LEVAQUIN) IV      Assessment/Plan:  1. Community-acquired pneumonia, potentially atypical with recent travel out of the country.  Bilateral small pleural effusion.  So far work-up negative.  Empiric Rocephin and Zithromax switched over the Levaquin as per ID.  Repeat chest x-ray shows bilateral pleural effusion.  If patient has a fever tonight I will order an ultrasound thoracentesis to look if there is enough fluid to be drawn off. 2. History of hypertension.  Blood pressure stable  off medication 3. Twitching of the face.  4. Impaired fasting glucose.  Code Status:     Code Status Orders  (From admission, onward)         Start     Ordered   03/29/18 0039  Full code  Continuous     03/29/18 0038        Code Status History    This patient has a current code status but no historical code status.     Antibiotics:  Levaquin  Time spent: 25 minutes  [place]

## 2018-04-01 NOTE — Telephone Encounter (Signed)
Copied from [place] [phone]. Topic: Compliment - Staff >> Apr 01, 2018 10:49 AM Blase Mess A wrote: CRM for notification. See Telephone encounter for: 04/01/18. Date of encounter: 03/26/18 Details of compliment: Patinet called to thank Modena Slater or Lattie Haw the nurse.  They gave her good advise. Patient went to the hospital after speaking to them and has nueponia. Who would the patient like to thank/see rewarded? Modena Slater or Lattie Haw  On a scale of 1-10, how was your experience? 10  Route to Engineer, building services.

## 2018-04-02 ENCOUNTER — Inpatient Hospital Stay: Payer: BC Managed Care – PPO

## 2018-04-02 LAB — BODY FLUID CELL COUNT WITH DIFFERENTIAL
Eos, Fluid: 0 %
Lymphs, Fluid: 2 %
MONOCYTE-MACROPHAGE-SEROUS FLUID: 2 %
NEUTROPHIL FLUID: 96 %
OTHER CELLS FL: 0 %
Total Nucleated Cell Count, Fluid: 416 cu mm

## 2018-04-02 LAB — PROTEIN, PLEURAL OR PERITONEAL FLUID

## 2018-04-02 LAB — GLUCOSE, PLEURAL OR PERITONEAL FLUID: Glucose, Fluid: 27 mg/dL

## 2018-04-02 MED ORDER — POTASSIUM CHLORIDE CRYS ER 20 MEQ PO TBCR
40.0000 meq | EXTENDED_RELEASE_TABLET | Freq: Once | ORAL | Status: AC
  Administered 2018-04-02: 15:00:00 40 meq via ORAL
  Filled 2018-04-02: qty 2

## 2018-04-02 MED ORDER — LEVOFLOXACIN 750 MG PO TABS: 750.0000 mg | ORAL_TABLET | Freq: Every day | ORAL | 0 refills | Status: DC

## 2018-04-02 MED ORDER — ACETAMINOPHEN 325 MG PO TABS: 650.0000 mg | ORAL_TABLET | Freq: Four times a day (QID) | ORAL | Status: DC | PRN

## 2018-04-02 MED ORDER — IBUPROFEN 400 MG PO TABS: 400.0000 mg | ORAL_TABLET | Freq: Two times a day (BID) | ORAL | 0 refills | Status: DC

## 2018-04-02 MED ORDER — LEVOFLOXACIN 750 MG PO TABS
750.0000 mg | ORAL_TABLET | ORAL | Status: AC
  Administered 2018-04-02: 750 mg via ORAL
  Filled 2018-04-02: qty 1

## 2018-04-02 NOTE — Procedures (Signed)
L thoracentesis 10 cc clear yellow fluid. EBL 0 Comp 0

## 2018-04-02 NOTE — Discharge Summary (Signed)
[facility] [place] NAME: Aimee Gomez    MR#:  [id number]  DATE OF BIRTH:  19-Jul-1955  DATE OF ADMISSION:  03/28/2018 ADMITTING PHYSICIAN: Amelia Jo, MD  DATE OF DISCHARGE: 04/02/2018  PRIMARY CARE PHYSICIAN: Einar Pheasant, MD    ADMISSION DIAGNOSIS:  Chest pain, unspecified type [R07.9] Community acquired pneumonia of left lower lobe of lung ([place]) [J18.1]  DISCHARGE DIAGNOSIS:  Active Problems:   CAP (community acquired pneumonia)   SECONDARY DIAGNOSIS:   Past Medical History:  Diagnosis Date  . Allergy   . Colon polyps   . Hearing loss, sensorineural 1993  . HSV-1 (herpes simplex virus 1) infection   . Hypertension   . Osteopenia   . Postmenopausal     HOSPITAL COURSE:   1.  Community-acquired pneumonia.  The patient had fever and also had pleural effusion.  CT scan showed patchy bilateral pulmonary infiltrates with nodular components especially in the right base.  The patient had pleuritic chest pain on the left base.  She did have bilateral pleural effusions.  We try to get a thoracentesis but only 10 cc were drawn out.  The patient was placed on Rocephin and Zithromax initially and then switched over to Levaquin and I will complete a course of this.  5 more days given upon discharge.  Patient has been taking some ibuprofen for pain.  Consider a CT scan in 8 weeks for evaluation and ensure that the pneumonia has resolved. 2.  History of hypertension.  Can restart low-dose lisinopril as outpatient 3.  Twitching of the face.  She states this happens when she is sick. 4.  Impaired fasting glucose.  Not a diabetic.  DISCHARGE CONDITIONS:   Satisfactory  CONSULTS OBTAINED:  Treatment Team:  Allyne Gee, MD Tsosie Billing, MD  DRUG ALLERGIES:  No Known Allergies  DISCHARGE MEDICATIONS:   Allergies as of 04/02/2018   No Known Allergies     Medication List    TAKE these medications   acetaminophen  325 MG tablet Commonly known as:  TYLENOL Take 2 tablets (650 mg total) by mouth every 6 (six) hours as needed for mild pain (or Fever >/= 101).   CITRACAL PO Take 1 tablet by mouth daily.   Fish Oil 600 MG Caps Take 1 tablet by mouth daily.   ibuprofen 400 MG tablet Commonly known as:  ADVIL,MOTRIN Take 1 tablet (400 mg total) by mouth 2 (two) times daily. What changed:    medication strength  how much to take  when to take this  reasons to take this   levofloxacin 750 MG tablet Commonly known as:  LEVAQUIN Take 1 tablet (750 mg total) by mouth daily.   lisinopril 10 MG tablet Commonly known as:  PRINIVIL,ZESTRIL TAKE 1 TABLET BY MOUTH EVERY DAY   triamcinolone 55 MCG/ACT nasal inhaler Commonly known as:  NASACORT Place 2 sprays into the nose daily.        DISCHARGE INSTRUCTIONS:   Follow-up PMD 5 days  If you experience worsening of your admission symptoms, develop shortness of breath, life threatening emergency, suicidal or homicidal thoughts you must seek medical attention immediately by calling 911 or calling your MD immediately  if symptoms less severe.  You Must read complete instructions/literature along with all the possible adverse reactions/side effects for all the Medicines you take and that have been prescribed to you. Take any new Medicines after you have completely understood and accept all the possible adverse  reactions/side effects.   Please note  You were cared for by a hospitalist during your hospital stay. If you have any questions about your discharge medications or the care you received while you were in the hospital after you are discharged, you can call the unit and asked to speak with the hospitalist on call if the hospitalist that took care of you is not available. Once you are discharged, your primary care physician will handle any further medical issues. Please note that NO REFILLS for any discharge medications will be authorized once you  are discharged, as it is imperative that you return to your primary care physician (or establish a relationship with a primary care physician if you do not have one) for your aftercare needs so that they can reassess your need for medications and monitor your lab values.    Today   CHIEF COMPLAINT:   Chief Complaint  Patient presents with  . Chest Pain  . Fever  . Nausea    HISTORY OF PRESENT ILLNESS:  Aimee Gomez  is a 62 y.o. female came in with chest pain fever nausea and found to have a pneumonia   VITAL SIGNS:  Blood pressure 134/60, pulse 75, temperature 98 F (36.7 C), temperature source Oral, resp. rate 18, height 5\' 3"  (1.6 m), weight 52.3 kg, last menstrual period 07/26/2003, SpO2 97 %.   PHYSICAL EXAMINATION:  GENERAL:  62 y.o.-year-old patient lying in the bed with no acute distress.  EYES: Pupils equal, round, reactive to light and accommodation. No scleral icterus. Extraocular muscles intact.  HEENT: Head atraumatic, normocephalic. Oropharynx and nasopharynx clear.  NECK:  Supple, no jugular venous distention. No thyroid enlargement, no tenderness.  LUNGS: Normal breath sounds bilaterally, no wheezing, rales,rhonchi or crepitation. No use of accessory muscles of respiration.  CARDIOVASCULAR: S1, S2 normal. No murmurs, rubs, or gallops.  ABDOMEN: Soft, non-tender, non-distended. Bowel sounds present. No organomegaly or mass.  EXTREMITIES: No pedal edema, cyanosis, or clubbing.  NEUROLOGIC: Cranial nerves II through XII are intact. Muscle strength 5/5 in all extremities. Sensation intact. Gait not checked.  PSYCHIATRIC: The patient is alert and oriented x 3.  SKIN: No obvious rash, lesion, or ulcer.   DATA REVIEW:   CBC Recent Labs  Lab 04/01/18 1449  WBC 9.5  HGB 10.2*  HCT 30.8*  PLT 374    Chemistries  Recent Labs  Lab 04/01/18 1449  NA 137  K 3.3*  CL 102  CO2 28  GLUCOSE 118*  BUN 16  CREATININE 0.72  CALCIUM 8.6*  AST 26  ALT 22   ALKPHOS 62  BILITOT 0.2*    Cardiac Enzymes Recent Labs  Lab 03/28/18 1739  TROPONINI <0.03    Microbiology Results  Results for orders placed or performed during the hospital encounter of 03/28/18  Respiratory Panel by PCR     Status: None   Collection Time: 03/29/18 11:47 AM  Result Value Ref Range Status   Adenovirus NOT DETECTED NOT DETECTED Final   Coronavirus 229E NOT DETECTED NOT DETECTED Final   Coronavirus HKU1 NOT DETECTED NOT DETECTED Final   Coronavirus NL63 NOT DETECTED NOT DETECTED Final   Coronavirus OC43 NOT DETECTED NOT DETECTED Final   Metapneumovirus NOT DETECTED NOT DETECTED Final   Rhinovirus / Enterovirus NOT DETECTED NOT DETECTED Final   Influenza A NOT DETECTED NOT DETECTED Final   Influenza B NOT DETECTED NOT DETECTED Final   Parainfluenza Virus 1 NOT DETECTED NOT DETECTED Final   Parainfluenza Virus 2  NOT DETECTED NOT DETECTED Final   Parainfluenza Virus 3 NOT DETECTED NOT DETECTED Final   Parainfluenza Virus 4 NOT DETECTED NOT DETECTED Final   Respiratory Syncytial Virus NOT DETECTED NOT DETECTED Final   Bordetella pertussis NOT DETECTED NOT DETECTED Final   Chlamydophila pneumoniae NOT DETECTED NOT DETECTED Final   Mycoplasma pneumoniae NOT DETECTED NOT DETECTED Final    Comment: Performed at [facility], [place] [address], [place], [place] [address]  MRSA culture     Status: None   Collection Time: 03/29/18 11:47 AM  Result Value Ref Range Status   Specimen Description   Final    FLUID Performed at [facility], [address], [place], [place] [address]    Special Requests   Final    NONE Performed at [facility], [address] [place], [place] [address]    Culture   Final    NEGATIVE Performed at [facility], [place] [address], [place], [place] [address]    Report Status 03/31/2018 FINAL  Final  CULTURE, BLOOD (ROUTINE X 2) w Reflex to ID Panel     Status: None (Preliminary result)   Collection Time:  03/29/18  5:37 PM  Result Value Ref Range Status   Specimen Description BLOOD RT [facility]  Final   Special Requests   Final    BOTTLES DRAWN AEROBIC AND ANAEROBIC Blood Culture results may not be optimal due to an inadequate volume of blood received in culture bottles   Culture   Final    NO GROWTH 4 DAYS Performed at [facility], [address], [place], [place] [address]    Report Status PENDING  Incomplete  CULTURE, BLOOD (ROUTINE X 2) w Reflex to ID Panel     Status: None (Preliminary result)   Collection Time: 03/29/18  5:37 PM  Result Value Ref Range Status   Specimen Description BLOOD LT [facility]  Final   Special Requests   Final    BOTTLES DRAWN AEROBIC AND ANAEROBIC Blood Culture results may not be optimal due to an inadequate volume of blood received in culture bottles   Culture   Final    NO GROWTH 4 DAYS Performed at [facility], [address], [place], [place] [address]    Report Status PENDING  Incomplete  CULTURE, BLOOD (ROUTINE X 2) w Reflex to ID Panel     Status: None (Preliminary result)   Collection Time: 03/30/18  9:17 PM  Result Value Ref Range Status   Specimen Description BLOOD RIGHT ANTECUBITAL  Final   Special Requests   Final    BOTTLES DRAWN AEROBIC AND ANAEROBIC Blood Culture results may not be optimal due to an excessive volume of blood received in culture bottles   Culture   Final    NO GROWTH 3 DAYS Performed at [facility], [address] [place],  [address]    Report Status PENDING  Incomplete  CULTURE, BLOOD (ROUTINE X 2) w Reflex to ID Panel     Status: None (Preliminary result)   Collection Time: 03/30/18  9:25 PM  Result Value Ref Range Status   Specimen Description BLOOD LEFT ANTECUBITAL  Final   Special Requests   Final    BOTTLES DRAWN AEROBIC AND ANAEROBIC Blood Culture results may not be optimal due to an excessive volume of blood received in culture bottles   Culture   Final    NO GROWTH 3 DAYS Performed  at [facility],  [place], [place] [address]    Report Status PENDING  Incomplete    RADIOLOGY:  Dg Chest 2 View  Result Date: 04/01/2018 CLINICAL DATA:  Fever, LEFT side pain, pleural effusion, history hypertension EXAM: CHEST - 2 VIEW COMPARISON:  03/28/2018 FINDINGS: Normal heart size, mediastinal contours, and pulmonary vascularity. Bibasilar effusions and atelectasis increased since previous exam. Underlying bronchitic and emphysematous changes. Infiltrate at lower RIGHT lung on previous exam slightly improved. No pneumothorax. Bones demineralized. IMPRESSION: COPD changes with increased bibasilar effusions and atelectasis. Slight improvement of RIGHT lung infiltrates since prior study. Electronically Signed   By: Lavonia Dana M.D.   On: 04/01/2018 14:24   Dg Chest Port 1 View  Result Date: 04/02/2018 CLINICAL DATA:  Post left thoracentesis EXAM: PORTABLE CHEST 1 VIEW COMPARISON:  03/28/2018 FINDINGS: Small bilateral pleural effusions are stable. Bibasilar pulmonary opacities are stable. Upper normal heart size. No pneumothorax. IMPRESSION: No pneumothorax post left thoracentesis. Electronically Signed   By: Marybelle Killings M.D.   On: 04/02/2018 13:53   US Thoracentesis Asp Pleural Space W/img Guide  Result Date: 04/02/2018 INDICATION: Left pleural effusion EXAM: ULTRASOUND GUIDED LEFT THORACENTESIS MEDICATIONS: None. COMPLICATIONS: None immediate. PROCEDURE: An ultrasound guided thoracentesis was thoroughly discussed with the patient and questions answered. The benefits, risks, alternatives and complications were also discussed. The patient understands and wishes to proceed with the procedure. Written consent was obtained. Ultrasound was performed to localize and mark an adequate pocket of fluid in the left chest. The area was then prepped and draped in the normal sterile fashion. 1% Lidocaine was used for local anesthesia. Under ultrasound guidance a 6 Fr  Safe-T-Centesis catheter was introduced. Thoracentesis was performed. The catheter was removed and a dressing applied. FINDINGS: A total of approximately 10 cc of clear yellow fluid was removed. Samples were sent to the laboratory as requested by the clinical team. IMPRESSION: Successful ultrasound guided left thoracentesis yielding 10 cc of pleural fluid. Electronically Signed   By: Marybelle Killings M.D.   On: 04/02/2018 13:53       Management plans discussed with the patient, family and they are in agreement.  CODE STATUS:     Code Status Orders  (From admission, onward)         Start     Ordered   03/29/18 0039  Full code  Continuous     03/29/18 0038        Code Status History    This patient has a current code status but no historical code status.      TOTAL TIME TAKING CARE OF THIS PATIENT: 35 minutes.    Loletha Grayer M.D on 04/02/2018 at 4:56 PM  Between 7am to 6pm - Pager - [phone]  After 6pm go to [email or web address] - password [email or web address]  [facility] Office  [phone]  CC: Primary care physician; Einar Pheasant, MD

## 2018-04-02 NOTE — Progress Notes (Signed)
? Shanine Kreiger is a 62 y.o. female with a history of hypertension presents with chills of 3 days duration, and chest pain on the left side of 1 day duration Patient is very active at baseline and has never been admitted to the hospital since 1993.  She presents with left-sided chest pain radiating down from the breast to the left hip and worse with movement that started 1 day duration and chills that started on Monday along with nausea some nasal congestion and postnasal drip. Patient was well of week ago.  She went to her PCP on October 17 for a routine checkup and was fine.  On October 18 she took a flight to go to [place] to catch her cruise.  She stayed in a [place] type of a hotel on Friday the 18th.  She boarded [place] cruise on Saturday around noon stop she was feeling well then.  On Sunday they went to [facility].  She walked in [place] a lot and also did a trolley tour.  She did not swim or snorkel.  She had a lobster roll in the evening.  And came back to the ship that night.  On Monday the ship was heading to [place].  That evening in the ship around 9 PM after dinner she had severe chills and had  to go to her cabin.  She took an Advil.  The next day she did a walking tour in [place].  This walking tour was a rum tour.  she did not do anything else which involved animals or white water rafting or swimming snorkeling or exploring caves or old buildings. At night she again had severe chills.  She had traveled with her friend who is an Therapist, sports.  But did not have a thermometer to check a temperature.  She felt that she was having some postnasal drip and thought she had a head cold.  She did not have any sore throat or cough.  On Wednesday early morning at 1:30 AM she woke up feeling sick and vomited once.  At that day she took the flight back to [place] from [facility].  When her husband picked her up at 5 PM that evening while coming home the car she was feeling sick again and vomited once.  She  went to bed but the next day she had pain in the left side of the chest under the breast going to her hip.  She went to the [facility] walk-in clinic.  Had a flu test which was negative.  And was sent to the emergency department.  In the ED her temperature was 98.7 blood pressure was 127/57 heart rate was 93 and respiratory rate was 18.She had a white count of 10.1, Hb of 11.5.  Creatinine of 1.01.  She had a chest x-ray which showed small bilateral pleural effusion left greater than right and airspace disease of the left base.  She then had a CTA which showed patchy bilateral pulmonary infiltrates primarily in the right upper lobe, right middle lobe and bilateral lower lobes there are nodular components with the largest nodule measuring 3.5 to 2.1 cm in the right base on axial image 48 this correlates with recent chest x-ray finding more confluent opacity seen in the left base and more patchy opacities in the right upper lobe.  She was started on IV ceftriaxone and IV azithromycin.  I am asked to see her for this atypical pneumonia. She had a temperature of 102.5 early this morning  around 10 AM. She is otherwise feeling a little better.  No cough. Before she left for the [place] the last weekend she had gone to [facility] where she owns a home and she spent time there.  She did not go to the beach.  Her last travel was to [place] in September.  She does not remember having any sick contacts.  She basically goes to the gym 3 times a week for yoga class and also does some balance exercises.  She is an avid walker.  She does not recollect any tick bite or insect bites.  She had a flu vaccine a month ago. she has not received her pneumococcal vaccine yet as she is only 41.  No recent antibiotic use.  She did consume lobster roll when she was in [facility] and that may have had raw fish.    Subjective Fever curve trending down  had left sided chest pain last night Had thorocentesis Objective:  VITALS:    Patient Vitals for the past 24 hrs:  BP Temp Temp src Pulse Resp SpO2 Weight  04/02/18 1330 134/60 - - 75 - 97 % -  04/02/18 1312 (!) 155/81 - - 75 18 100 % -  04/02/18 0440 133/74 98 F (36.7 C) Oral 81 18 95 % -  04/02/18 0228 - - - - - - 52.3 kg  04/01/18 2330 - 99.5 F (37.5 C) Oral - - - -  04/01/18 1944 (!) 146/81 98.3 F (36.8 C) Oral 93 18 99 % -  PHYSICAL EXAM:  General: Alert, cooperative, no distress,  Head: Normocephalic, without obvious abnormality, atraumatic. Eyes: Conjunctivae clear, anicteric sclerae. Pupils are equal ENT Nares normal. No drainage or sinus tenderness. Lips, mucosa, and tongue normal. No Thrush Neck: Supple, symmetrical, no adenopathy, thyroid: non tender no carotid bruit and no JVD. Back: No CVA tenderness. Lungs: Bilateral air entry.  Bronchial breathing left base-Decreased left base. Heart: Regular rate and rhythm, no murmur, rub or gallop. Abdomen: Soft, non-tender,not distended. Bowel sounds normal. No masses Extremities: atraumatic, no cyanosis. No edema. No clubbing Skin: Tanned, no rashes or lesions. Or bruising Lymph: Cervical, supraclavicular normal. Neurologic: Grossly non-focal Pertinent Labs CBC Latest Ref Rng & Units 04/01/2018 03/30/2018 03/29/2018  WBC 4.0 - 10.5 K/uL 9.5 12.3(H) 10.1  Hemoglobin 12.0 - 15.0 g/dL 10.2(L) 11.1(L) 11.5(L)  Hematocrit 36.0 - 46.0 % 30.8(L) 33.6(L) 35.4(L)  Platelets 150 - 400 K/uL 374 248 203   CMP Latest Ref Rng & Units 04/01/2018 03/30/2018 03/29/2018  Glucose 70 - 99 mg/dL 118(H) 107(H) 125(H)  BUN 8 - 23 mg/dL 16 11 15   Creatinine 0.44 - 1.00 mg/dL 0.72 0.72 0.83  Sodium 135 - 145 mmol/L 137 137 134(L)  Potassium 3.5 - 5.1 mmol/L 3.3(L) 3.9 3.6  Chloride 98 - 111 mmol/L 102 101 99  CO2 22 - 32 mmol/L 28 28 26   Calcium 8.9 - 10.3 mg/dL 8.6(L) 8.3(L) 8.1(L)  Total Protein 6.5 - 8.1 g/dL 6.0(L) - -  Total Bilirubin 0.3 - 1.2 mg/dL 0.2(L) - -  Alkaline Phos 38 - 126 U/L 62 - -  AST 15 - 41  U/L 26 - -  ALT 0 - 44 U/L 22 - -   Pleural fluid- wbc 416 ( 96% N) TP <3  IMAGING RESULTS: ? Impression/Recommendation 62 y.o. active and healthy female with a history of hypertension presents with chills of 3 days duration, and chest pain on the left side of 1 day duration  Fever  and chills with bilateral nodular infiltrate and bilateral pleural effusion.  Concern for atypical pneumonia.  She fell ill 48 hours into her cruise.  So not sure whether that could have been a source but it is possible for a viral infection.  She also stayed in a hotel in [facility]  So atypical pneumonia like Legionella or mycoplasma in D>D   Streptococcus pneumonia or other bacterial pneumonia less likely.   coccidiomycosis or other or fungal infection less likely.  Also she has not had any risk for Coxiella, tularemia or leptospirosis.  She is not septic.   She has normal kidney and liver function.  No thrombocytopenia.  She was on azithromycin and ceftriaoxne and it was changed to levaquin yesterday' resp viral PCR neg, Urine legionella Neg, strep pneum neg The pleural fluid looks like transudate. Await culture result but not hopeful that it will yield bacteria ? __Hypertension borderline used to be on lisinopril.  History of sensorineural hearing loss on the left side _________________________________________________ Discussed with patient and hospitalist  in great detail.   Pt wants to go home- On levaquin 500mg  X 5 days Discussed side effects of levaquin and asked her to contact me if she has one Follow up pleural fluid results

## 2018-04-02 NOTE — Discharge Instructions (Signed)
Recommend follow up CT scan of the chest in 8 week to ensure clearing of pneumonia

## 2018-04-03 ENCOUNTER — Telehealth: Payer: Self-pay | Admitting: Internal Medicine

## 2018-04-03 LAB — CULTURE, BLOOD (ROUTINE X 2)
CULTURE: NO GROWTH
Culture: NO GROWTH

## 2018-04-03 LAB — PROTEIN, BODY FLUID (OTHER): Total Protein, Body Fluid Other: 2.4 g/dL

## 2018-04-03 NOTE — Telephone Encounter (Signed)
Ok to call and schedule patient within the next 7 days please ma'am.

## 2018-04-03 NOTE — Telephone Encounter (Signed)
Does this need a TCM?  

## 2018-04-03 NOTE — Telephone Encounter (Signed)
Copied from [place] [phone]. Topic: Appointment Scheduling - Scheduling Inquiry for Clinic >> Apr 03, 2018  9:31 AM Bea Graff, NT wrote: Reason for CRM: Pt calling to schedule a hospital follow-up with Dr. Nicki Reaper within the next week. Pt was in the hospital for pneumonia. Can pt be worked in? Please call pt to schedule.

## 2018-04-03 NOTE — Telephone Encounter (Signed)
Pt called and scheduled

## 2018-04-03 NOTE — Telephone Encounter (Signed)
If you need an appt just let me know. She has a couple openings on Wednesday next week

## 2018-04-03 NOTE — Telephone Encounter (Signed)
No TCM required for this hospital visit.

## 2018-04-04 ENCOUNTER — Other Ambulatory Visit: Payer: BC Managed Care – PPO

## 2018-04-04 LAB — FUNGAL ANTIBODIES PANEL, ID-BLOOD
ASPERGILLUS FUMIGATUS IGG: NEGATIVE
Aspergillus flavus: NEGATIVE
Aspergillus niger: NEGATIVE
Blastomyces Abs, Qn, DID: NEGATIVE
Histoplasma Ab, Immunodiffusion: NEGATIVE

## 2018-04-04 LAB — CULTURE, BLOOD (ROUTINE X 2)
CULTURE: NO GROWTH
Culture: NO GROWTH

## 2018-04-04 LAB — CYTOLOGY - NON PAP

## 2018-04-06 LAB — BODY FLUID CULTURE
CULTURE: NO GROWTH
Special Requests: NORMAL

## 2018-04-10 ENCOUNTER — Encounter: Payer: Self-pay | Admitting: Internal Medicine

## 2018-04-10 ENCOUNTER — Ambulatory Visit (INDEPENDENT_AMBULATORY_CARE_PROVIDER_SITE_OTHER): Payer: BC Managed Care – PPO | Admitting: Internal Medicine

## 2018-04-10 VITALS — BP 110/70 | HR 76 | Temp 97.6°F | Resp 16 | Wt 110.8 lb

## 2018-04-10 DIAGNOSIS — R9389 Abnormal findings on diagnostic imaging of other specified body structures: Secondary | ICD-10-CM

## 2018-04-10 DIAGNOSIS — D649 Anemia, unspecified: Secondary | ICD-10-CM

## 2018-04-10 DIAGNOSIS — R739 Hyperglycemia, unspecified: Secondary | ICD-10-CM

## 2018-04-10 DIAGNOSIS — J189 Pneumonia, unspecified organism: Secondary | ICD-10-CM | POA: Diagnosis not present

## 2018-04-10 DIAGNOSIS — I10 Essential (primary) hypertension: Secondary | ICD-10-CM

## 2018-04-10 DIAGNOSIS — E876 Hypokalemia: Secondary | ICD-10-CM | POA: Diagnosis not present

## 2018-04-10 LAB — CBC WITH DIFFERENTIAL/PLATELET
BASOS ABS: 0.1 10*3/uL (ref 0.0–0.1)
Basophils Relative: 1.2 % (ref 0.0–3.0)
EOS PCT: 5.5 % — AB (ref 0.0–5.0)
Eosinophils Absolute: 0.5 10*3/uL (ref 0.0–0.7)
HCT: 34.2 % — ABNORMAL LOW (ref 36.0–46.0)
HEMOGLOBIN: 11.4 g/dL — AB (ref 12.0–15.0)
LYMPHS ABS: 1.7 10*3/uL (ref 0.7–4.0)
LYMPHS PCT: 19.3 % (ref 12.0–46.0)
MCHC: 33.2 g/dL (ref 30.0–36.0)
MCV: 89.6 fl (ref 78.0–100.0)
Monocytes Absolute: 0.8 10*3/uL (ref 0.1–1.0)
Monocytes Relative: 8.6 % (ref 3.0–12.0)
NEUTROS PCT: 65.4 % (ref 43.0–77.0)
Neutro Abs: 5.7 10*3/uL (ref 1.4–7.7)
Platelets: 676 10*3/uL — ABNORMAL HIGH (ref 150.0–400.0)
RBC: 3.82 Mil/uL — AB (ref 3.87–5.11)
RDW: 13.9 % (ref 11.5–15.5)
WBC: 8.8 10*3/uL (ref 4.0–10.5)

## 2018-04-10 LAB — COMPREHENSIVE METABOLIC PANEL
ALBUMIN: 3.5 g/dL (ref 3.5–5.2)
ALK PHOS: 70 U/L (ref 39–117)
ALT: 17 U/L (ref 0–35)
AST: 17 U/L (ref 0–37)
BILIRUBIN TOTAL: 0.2 mg/dL (ref 0.2–1.2)
BUN: 20 mg/dL (ref 6–23)
CALCIUM: 9.2 mg/dL (ref 8.4–10.5)
CO2: 31 mEq/L (ref 19–32)
Chloride: 101 mEq/L (ref 96–112)
Creatinine, Ser: 0.77 mg/dL (ref 0.40–1.20)
GFR: 80.31 mL/min (ref >60.00)
Glucose, Bld: 95 mg/dL (ref 70–99)
POTASSIUM: 4.4 meq/L (ref 3.5–5.1)
Sodium: 139 mEq/L (ref 135–145)
TOTAL PROTEIN: 7 g/dL (ref 6.0–8.3)

## 2018-04-10 NOTE — Progress Notes (Signed)
[name], female   DOB: [birth year], 62 y.o.   MRN: [id number]   Subjective:    [name], female    DOB: May 15, 1956, 62 y.o.   MRN: [id number]  HPI  Patient here for hospital follow up.  Admitted 03/28/18 and diagnosed with pneumonia.  CT revealed bilateral patchy pulmonary infiltrates with nodular components especially in the right base.  Found to have bilateral pleural effusions.  Attempted thoracentesis - 10 cc's removed.  Placed on Rocephin and zithromax and changed to levaquin.  Discharged to continue levaquin.  States she feels better.  The pleuritic chest pain is better.  Still some soreness, but improved.  No sob.  No acid reflux.  No increased cough or congestion.  No nausea or vomiting.  Eating.  Discussed the need for f/u chest CT.     Past Medical History:  Diagnosis Date  . Allergy   . Colon polyps   . Hearing loss, sensorineural 1993  . HSV-1 (herpes simplex virus 1) infection   . Hypertension   . Osteopenia   . Postmenopausal    Past Surgical History:  Procedure Laterality Date  . BREAST BIOPSY  1977   Family History  Problem Relation Age of Onset  . Heart disease Father        CHF  . Diabetes Father    Social History   Socioeconomic History  . Marital status: Married    Spouse name: Not on file  . Number of children: Not on file  . Years of education: Not on file  . Highest education level: Not on file  Occupational History  . Not on file  Social Needs  . Financial resource strain: Not on file  . Food insecurity:    Worry: Not on file    Inability: Not on file  . Transportation needs:    Medical: Not on file    Non-medical: Not on file  Tobacco Use  . Smoking status: Never Smoker  . Smokeless tobacco: Never Used  Substance and Sexual Activity  . Alcohol use: Yes    Alcohol/week: 0.0 standard drinks  . Drug use: No  . Sexual activity: Not on file  Lifestyle  . Physical activity:    Days per week: Not on file   Minutes per session: Not on file  . Stress: Not on file  Relationships  . Social connections:    Talks on phone: Not on file    Gets together: Not on file    Attends religious service: Not on file    Active member of club or organization: Not on file    Attends meetings of clubs or organizations: Not on file    Relationship status: Not on file  Other Topics Concern  . Not on file  Social History Narrative  . Not on file    Outpatient Encounter Medications as of 04/10/2018  Medication Sig  . Calcium Citrate (CITRACAL PO) Take 1 tablet by mouth daily.  Marland Kitchen ibuprofen (ADVIL,MOTRIN) 400 MG tablet Take 1 tablet (400 mg total) by mouth 2 (two) times daily.  Marland Kitchen lisinopril (PRINIVIL,ZESTRIL) 10 MG tablet TAKE 1 TABLET BY MOUTH EVERY DAY  . Omega-3 Fatty Acids (FISH OIL) 600 MG CAPS Take 1 tablet by mouth daily.  Marland Kitchen triamcinolone (NASACORT) 55 MCG/ACT nasal inhaler Place 2 sprays into the nose daily.  . [DISCONTINUED] acetaminophen (TYLENOL) 325 MG tablet Take 2 tablets (650 mg total) by mouth every 6 (six) hours as needed for mild pain (  or Fever >/= 101).  . [DISCONTINUED] levofloxacin (LEVAQUIN) 750 MG tablet Take 1 tablet (750 mg total) by mouth daily.   No facility-administered encounter medications on file as of 04/10/2018.     Review of Systems  Constitutional:       Appetite improved.  Weight is down.    HENT: Negative for congestion and sinus pressure.   Respiratory: Negative for chest tightness.        No increased cough or congestion.  Improved.    Cardiovascular: Positive for chest pain. Negative for palpitations and leg swelling.  Gastrointestinal: Negative for abdominal pain, diarrhea, nausea and vomiting.  Genitourinary: Negative for difficulty urinating and dysuria.  Musculoskeletal: Negative for joint swelling and myalgias.  Skin: Negative for color change and rash.  Neurological: Negative for dizziness and headaches.  Psychiatric/Behavioral: Negative for agitation and  dysphoric mood.       Objective:    Physical Exam  Constitutional: She appears well-developed and well-nourished. No distress.  HENT:  Nose: Nose normal.  Mouth/Throat: Oropharynx is clear and moist.  Neck: Neck supple. No thyromegaly present.  Cardiovascular: Normal rate and regular rhythm.  Pulmonary/Chest: Breath sounds normal. No respiratory distress. She has no wheezes.  Abdominal: Soft. Bowel sounds are normal. There is no tenderness.  Musculoskeletal: She exhibits no edema or tenderness.  Lymphadenopathy:    She has no cervical adenopathy.  Skin: No rash noted. No erythema.  Psychiatric: She has a normal mood and affect. Her behavior is normal.    BP 110/70 (BP Location: Left Arm, Patient Position: Sitting, Cuff Size: Normal)   Pulse 76   Temp 97.6 F (36.4 C) (Oral)   Resp 16   Wt 110 lb 12.8 oz (50.3 kg)   LMP 07/26/2003   SpO2 97%   BMI 19.63 kg/m  Wt Readings from Last 3 Encounters:  04/10/18 110 lb 12.8 oz (50.3 kg)  04/02/18 115 lb 4.8 oz (52.3 kg)  03/21/18 113 lb 9.6 oz (51.5 kg)     Lab Results  Component Value Date   WBC 8.8 04/10/2018   HGB 11.4 (L) 04/10/2018   HCT 34.2 (L) 04/10/2018   PLT 676.0 (H) 04/10/2018   GLUCOSE 95 04/10/2018   CHOL 201 (H) 05/22/2017   TRIG 56.0 05/22/2017   HDL 85.20 05/22/2017   LDLDIRECT 113.2 08/07/2012   LDLCALC 104 (H) 05/22/2017   ALT 17 04/10/2018   AST 17 04/10/2018   NA 139 04/10/2018   K 4.4 04/10/2018   CL 101 04/10/2018   CREATININE 0.77 04/10/2018   BUN 20 04/10/2018   CO2 31 04/10/2018   TSH 3.26 02/01/2017   HGBA1C 5.9 05/22/2017    Dg Chest 2 View  Result Date: 03/28/2018 CLINICAL DATA:  Chest pain EXAM: CHEST - 2 VIEW COMPARISON:  None. FINDINGS: Small bilateral pleural effusions, left greater than right. Airspace disease at the left base. Possible 1 cm nodule in the right lower lung. Normal heart size. No pneumothorax. IMPRESSION: 1. Small left greater than right pleural effusions with  left basilar atelectasis or pneumonia 2. Possible 1 cm right lower lung nodule. Suggest chest CT to further evaluate. Electronically Signed   By: Donavan Foil M.D.   On: 03/28/2018 21:10   Ct Angio Chest Pe W And/or Wo Contrast  Result Date: 03/28/2018 CLINICAL DATA:  Dual constant chest pain. EXAM: CT ANGIOGRAPHY CHEST WITH CONTRAST TECHNIQUE: Multidetector CT imaging of the chest was performed using the standard protocol during bolus administration of intravenous contrast. Multiplanar  CT image reconstructions and MIPs were obtained to evaluate the vascular anatomy. CONTRAST:  33m OMNIPAQUE IOHEXOL 350 MG/ML SOLN COMPARISON:  Chest x-ray March 28, 2018 FINDINGS: Cardiovascular: No thoracic aortic aneurysm or dissection. The heart size is normal. No pulmonary emboli. Mediastinum/Nodes: Small bilateral pleural effusions, left greater than right. No pericardial effusion. There is a small hiatal hernia. The thyroid is unremarkable. No adenopathy. Lungs/Pleura: Central airways are normal. No pneumothorax. Patchy bilateral pulmonary infiltrates primarily in the right upper lobe, right middle lobe, and bilateral lower lobes. There are nodular components with the largest nodule measuring 3.5 x 2.1 cm in the right base on axial image 48. This correlates with the recent chest x-ray finding. More confluent opacity is seen in the left base and more patchy opacity is seen in the right upper lobe with a 9 mm nodular component on image 33. More mild involvement of the left upper lobe is identified. Upper Abdomen: No acute abnormality. Musculoskeletal: No chest wall abnormality. No acute or significant osseous findings. Review of the MIP images confirms the above findings. IMPRESSION: 1. Patchy bilateral pulmonary infiltrates. The findings are most consistent with an infectious process. However, there are nodular components, with the largest in the right base measuring 3.5 x 2.1 cm. Recommend treatment for infection with  a short-term follow-up in approximately 1-2 months to ensure resolution. 2. Small bilateral pleural effusions. 3. Small hiatal hernia. Electronically Signed   By: DDorise BullionIII M.D   On: 03/28/2018 21:54       Assessment & Plan:   Problem List Items Addressed This Visit    CAP (community acquired pneumonia) - Primary    Recently admitted.  Treated initially with rocephin and zithromax.  Changed to levaquin.  Feeling better.  Pleuritic chest pain better.  No sob.  Plans for f/u chest CT in the next 4 weeks.  Schedule f/u appt.        Relevant Orders   CT Chest Wo Contrast   Hyperglycemia    Low carb diet and exercise. Follow met b and a1c.        Hypertension, essential    Blood pressure under good control.  Continue same medication regimen.  Follow pressures.  Follow metabolic panel.         Other Visit Diagnoses    Anemia, unspecified type       Relevant Orders   CBC with Differential/Platelet (Completed)   Hypokalemia       Relevant Orders   Comprehensive metabolic panel (Completed)   Abnormal chest CT       Relevant Orders   CT Chest Wo Contrast       CEinar Pheasant MD

## 2018-04-12 ENCOUNTER — Other Ambulatory Visit: Payer: Self-pay | Admitting: Internal Medicine

## 2018-04-12 DIAGNOSIS — D75839 Thrombocytosis, unspecified: Secondary | ICD-10-CM

## 2018-04-12 DIAGNOSIS — D473 Essential (hemorrhagic) thrombocythemia: Secondary | ICD-10-CM

## 2018-04-12 NOTE — Progress Notes (Signed)
Orders placed for f/u labs.  

## 2018-04-13 ENCOUNTER — Encounter: Payer: Self-pay | Admitting: Internal Medicine

## 2018-04-13 NOTE — Assessment & Plan Note (Signed)
Low carb diet and exercise. Follow met b and a1c.

## 2018-04-13 NOTE — Assessment & Plan Note (Signed)
Recently admitted.  Treated initially with rocephin and zithromax.  Changed to levaquin.  Feeling better.  Pleuritic chest pain better.  No sob.  Plans for f/u chest CT in the next 4 weeks.  Schedule f/u appt.

## 2018-04-13 NOTE — Assessment & Plan Note (Signed)
Blood pressure under good control.  Continue same medication regimen.  Follow pressures.  Follow metabolic panel.   

## 2018-04-16 ENCOUNTER — Other Ambulatory Visit (INDEPENDENT_AMBULATORY_CARE_PROVIDER_SITE_OTHER): Payer: BC Managed Care – PPO

## 2018-04-16 DIAGNOSIS — D473 Essential (hemorrhagic) thrombocythemia: Secondary | ICD-10-CM | POA: Diagnosis not present

## 2018-04-16 DIAGNOSIS — D75839 Thrombocytosis, unspecified: Secondary | ICD-10-CM

## 2018-04-16 LAB — CBC WITH DIFFERENTIAL/PLATELET
BASOS PCT: 0.8 % (ref 0.0–3.0)
Basophils Absolute: 0.1 10*3/uL (ref 0.0–0.1)
Eosinophils Absolute: 0.4 10*3/uL (ref 0.0–0.7)
Eosinophils Relative: 5.3 % — ABNORMAL HIGH (ref 0.0–5.0)
HCT: 35.2 % — ABNORMAL LOW (ref 36.0–46.0)
HEMOGLOBIN: 11.7 g/dL — AB (ref 12.0–15.0)
LYMPHS ABS: 1.9 10*3/uL (ref 0.7–4.0)
Lymphocytes Relative: 24.4 % (ref 12.0–46.0)
MCHC: 33.1 g/dL (ref 30.0–36.0)
MCV: 89.4 fl (ref 78.0–100.0)
MONO ABS: 0.5 10*3/uL (ref 0.1–1.0)
Monocytes Relative: 6.2 % (ref 3.0–12.0)
NEUTROS ABS: 4.9 10*3/uL (ref 1.4–7.7)
NEUTROS PCT: 63.3 % (ref 43.0–77.0)
PLATELETS: 558 10*3/uL — AB (ref 150.0–400.0)
RBC: 3.94 Mil/uL (ref 3.87–5.11)
RDW: 13.3 % (ref 11.5–15.5)
WBC: 7.8 10*3/uL (ref 4.0–10.5)

## 2018-04-16 LAB — FERRITIN: Ferritin: 174.4 ng/mL (ref 10.0–291.0)

## 2018-04-16 LAB — IBC PANEL
IRON: 34 ug/dL — AB (ref 42–145)
Saturation Ratios: 11.9 % — ABNORMAL LOW (ref 20.0–50.0)
Transferrin: 204 mg/dL — ABNORMAL LOW (ref 212.0–360.0)

## 2018-04-17 ENCOUNTER — Other Ambulatory Visit: Payer: Self-pay | Admitting: Internal Medicine

## 2018-04-17 DIAGNOSIS — D649 Anemia, unspecified: Secondary | ICD-10-CM

## 2018-04-17 NOTE — Progress Notes (Signed)
Order placed for f/u labs.  

## 2018-04-22 ENCOUNTER — Other Ambulatory Visit: Payer: Self-pay | Admitting: Internal Medicine

## 2018-04-22 DIAGNOSIS — D509 Iron deficiency anemia, unspecified: Secondary | ICD-10-CM

## 2018-04-22 NOTE — Progress Notes (Signed)
Order placed for GI referral.   

## 2018-04-23 ENCOUNTER — Ambulatory Visit
Admission: RE | Admit: 2018-04-23 | Discharge: 2018-04-23 | Disposition: A | Discharge status: Home / Self Care | Payer: BC Managed Care – PPO | Source: Ambulatory Visit | Attending: Internal Medicine | Admitting: Internal Medicine

## 2018-04-23 DIAGNOSIS — M8580 Other specified disorders of bone density and structure, unspecified site: Secondary | ICD-10-CM | POA: Insufficient documentation

## 2018-04-23 DIAGNOSIS — Z1239 Encounter for other screening for malignant neoplasm of breast: Secondary | ICD-10-CM

## 2018-04-25 ENCOUNTER — Encounter: Payer: Self-pay | Admitting: Internal Medicine

## 2018-05-09 ENCOUNTER — Ambulatory Visit
Admission: RE | Admit: 2018-05-09 | Discharge: 2018-05-09 | Disposition: A | Discharge status: Home / Self Care | Payer: BC Managed Care – PPO | Source: Ambulatory Visit | Attending: Internal Medicine | Admitting: Internal Medicine

## 2018-05-09 DIAGNOSIS — R9389 Abnormal findings on diagnostic imaging of other specified body structures: Secondary | ICD-10-CM | POA: Insufficient documentation

## 2018-05-09 DIAGNOSIS — J189 Pneumonia, unspecified organism: Secondary | ICD-10-CM | POA: Diagnosis present

## 2018-05-10 ENCOUNTER — Other Ambulatory Visit: Payer: Self-pay | Admitting: Internal Medicine

## 2018-05-10 DIAGNOSIS — R9389 Abnormal findings on diagnostic imaging of other specified body structures: Secondary | ICD-10-CM

## 2018-05-10 NOTE — Progress Notes (Signed)
Order placed for pulmonary referral.  

## 2018-05-14 ENCOUNTER — Encounter: Payer: Self-pay | Admitting: Internal Medicine

## 2018-05-14 ENCOUNTER — Ambulatory Visit: Payer: BC Managed Care – PPO | Admitting: Internal Medicine

## 2018-05-14 DIAGNOSIS — I10 Essential (primary) hypertension: Secondary | ICD-10-CM

## 2018-05-14 DIAGNOSIS — J189 Pneumonia, unspecified organism: Secondary | ICD-10-CM | POA: Diagnosis not present

## 2018-05-14 DIAGNOSIS — R739 Hyperglycemia, unspecified: Secondary | ICD-10-CM | POA: Diagnosis not present

## 2018-05-14 DIAGNOSIS — M8580 Other specified disorders of bone density and structure, unspecified site: Secondary | ICD-10-CM

## 2018-05-14 DIAGNOSIS — D649 Anemia, unspecified: Secondary | ICD-10-CM

## 2018-05-14 LAB — CBC WITH DIFFERENTIAL/PLATELET
Basophils Absolute: 0 10*3/uL (ref 0.0–0.1)
Basophils Relative: 0.4 % (ref 0.0–3.0)
EOS PCT: 3.5 % (ref 0.0–5.0)
Eosinophils Absolute: 0.2 10*3/uL (ref 0.0–0.7)
HEMATOCRIT: 36.6 % (ref 36.0–46.0)
Hemoglobin: 12 g/dL (ref 12.0–15.0)
Lymphocytes Relative: 36.9 % (ref 12.0–46.0)
Lymphs Abs: 2.1 10*3/uL (ref 0.7–4.0)
MCHC: 32.9 g/dL (ref 30.0–36.0)
MCV: 89.1 fl (ref 78.0–100.0)
Monocytes Absolute: 0.4 10*3/uL (ref 0.1–1.0)
Monocytes Relative: 7.3 % (ref 3.0–12.0)
Neutro Abs: 3 10*3/uL (ref 1.4–7.7)
Neutrophils Relative %: 51.9 % (ref 43.0–77.0)
PLATELETS: 260 10*3/uL (ref 150.0–400.0)
RBC: 4.1 Mil/uL (ref 3.87–5.11)
RDW: 13.9 % (ref 11.5–15.5)
WBC: 5.7 10*3/uL (ref 4.0–10.5)

## 2018-05-14 LAB — IBC PANEL
Iron: 48 ug/dL (ref 42–145)
SATURATION RATIOS: 16.9 % — AB (ref 20.0–50.0)
Transferrin: 203 mg/dL — ABNORMAL LOW (ref 212.0–360.0)

## 2018-05-14 LAB — FERRITIN: Ferritin: 65.2 ng/mL (ref 10.0–291.0)

## 2018-05-14 NOTE — Progress Notes (Signed)
[name], female   DOB: 18-Oct-1955, 62 y.o.   MRN: [id number]   Subjective:    [name], female    DOB: [birth year], 62 y.o.   MRN: [id number]  HPI  Patient here for a scheduled follow up.  She was recently admitted and diagnosed with pneumonia.  CT revealed bilateral patchy pulmonary infiltrates with nodular components especially in the right base.  Also found to have bilateral pleural effusions.  Attempted thoracentesis - 10cc's removed.  Placed on rocephin and zithromax and changed to levaquin.  Completed levaquin as outpatient.  Feeling better.  Energy is improving.  Dong yoga.  Still some fatigue, but better.  Due to see GI this week for iron deficient anemia.  Also, recently had f/u CT chest.  Improved.  Still with small loculated pleural effusion.  Due to f/u with pulmonary for further evaluation and w/up.  She denies any chest pain.  Breathing improved.  No acid reflux.  No abdominal pain.  Bowels moving.  Eating.  Discussed bone density results.     Past Medical History:  Diagnosis Date  . Allergy   . Colon polyps   . Hearing loss, sensorineural 1993  . HSV-1 (herpes simplex virus 1) infection   . Hypertension   . Osteopenia   . Postmenopausal    Past Surgical History:  Procedure Laterality Date  . BREAST BIOPSY  1977   Family History  Problem Relation Age of Onset  . Heart disease Father        CHF  . Diabetes Father   . Breast cancer Neg Hx    Social History   Socioeconomic History  . Marital status: Married    Spouse name: Not on file  . Number of children: Not on file  . Years of education: Not on file  . Highest education level: Not on file  Occupational History  . Not on file  Social Needs  . Financial resource strain: Not on file  . Food insecurity:    Worry: Not on file    Inability: Not on file  . Transportation needs:    Medical: Not on file    Non-medical: Not on file  Tobacco Use  . Smoking status: Never Smoker  .  Smokeless tobacco: Never Used  Substance and Sexual Activity  . Alcohol use: Yes    Alcohol/week: 0.0 standard drinks  . Drug use: No  . Sexual activity: Not on file  Lifestyle  . Physical activity:    Days per week: Not on file    Minutes per session: Not on file  . Stress: Not on file  Relationships  . Social connections:    Talks on phone: Not on file    Gets together: Not on file    Attends religious service: Not on file    Active member of club or organization: Not on file    Attends meetings of clubs or organizations: Not on file    Relationship status: Not on file  Other Topics Concern  . Not on file  Social History Narrative  . Not on file    Outpatient Encounter Medications as of 05/14/2018  Medication Sig  . Calcium Citrate (CITRACAL PO) Take 1 tablet by mouth daily.  . Ferrous Sulfate Dried (FEOSOL) 200 (65 Fe) MG TABS   . ibuprofen (ADVIL,MOTRIN) 400 MG tablet Take 1 tablet (400 mg total) by mouth 2 (two) times daily.  Marland Kitchen lisinopril (PRINIVIL,ZESTRIL) 10 MG tablet TAKE 1 TABLET BY  MOUTH EVERY DAY  . Omega-3 Fatty Acids (FISH OIL) 600 MG CAPS Take 1 tablet by mouth daily.  Marland Kitchen triamcinolone (NASACORT) 55 MCG/ACT nasal inhaler Place 2 sprays into the nose daily.   No facility-administered encounter medications on file as of 05/14/2018.     Review of Systems  Constitutional: Negative for appetite change and unexpected weight change.  HENT: Negative for congestion and sinus pressure.   Respiratory: Negative for cough, chest tightness and shortness of breath.   Cardiovascular: Negative for chest pain, palpitations and leg swelling.  Gastrointestinal: Negative for abdominal pain, diarrhea, nausea and vomiting.  Genitourinary: Negative for difficulty urinating and dysuria.  Musculoskeletal: Negative for joint swelling and myalgias.  Skin: Negative for color change and rash.  Neurological: Negative for dizziness, light-headedness and headaches.  Psychiatric/Behavioral:  Negative for agitation and dysphoric mood.       Objective:    Physical Exam Constitutional:      General: She is not in acute distress.    Appearance: Normal appearance.  HENT:     Nose: Nose normal. No congestion.  Neck:     Musculoskeletal: Neck supple. No muscular tenderness.     Thyroid: No thyromegaly.  Cardiovascular:     Rate and Rhythm: Normal rate and regular rhythm.  Pulmonary:     Effort: No respiratory distress.     Breath sounds: Normal breath sounds. No wheezing.  Abdominal:     General: Bowel sounds are normal.     Palpations: Abdomen is soft.     Tenderness: There is no abdominal tenderness.  Musculoskeletal:        General: No swelling or tenderness.  Lymphadenopathy:     Cervical: No cervical adenopathy.  Neurological:     Mental Status: She is alert.  Psychiatric:        Mood and Affect: Mood normal.        Behavior: Behavior normal.     BP 120/64 (BP Location: Left Arm, Patient Position: Sitting, Cuff Size: Normal)   Pulse 67   Temp 97.7 F (36.5 C) (Oral)   Resp 18   Wt 114 lb (51.7 kg)   LMP 07/26/2003   SpO2 96%   BMI 20.19 kg/m  Wt Readings from Last 3 Encounters:  05/14/18 114 lb (51.7 kg)  04/10/18 110 lb 12.8 oz (50.3 kg)  04/02/18 115 lb 4.8 oz (52.3 kg)     Lab Results  Component Value Date   WBC 5.7 05/14/2018   HGB 12.0 05/14/2018   HCT 36.6 05/14/2018   PLT 260.0 05/14/2018   GLUCOSE 95 04/10/2018   CHOL 201 (H) 05/22/2017   TRIG 56.0 05/22/2017   HDL 85.20 05/22/2017   LDLDIRECT 113.2 08/07/2012   LDLCALC 104 (H) 05/22/2017   ALT 17 04/10/2018   AST 17 04/10/2018   NA 139 04/10/2018   K 4.4 04/10/2018   CL 101 04/10/2018   CREATININE 0.77 04/10/2018   BUN 20 04/10/2018   CO2 31 04/10/2018   TSH 3.26 02/01/2017   HGBA1C 5.9 05/22/2017    Ct Chest Wo Contrast  Result Date: 05/09/2018 CLINICAL DATA:  History of multifocal pneumonia and left-sided pleural effusion, follow-up exam EXAM: CT CHEST WITHOUT  CONTRAST TECHNIQUE: Multidetector CT imaging of the chest was performed following the standard protocol without IV contrast. COMPARISON:  03/28/2018 FINDINGS: Cardiovascular: Somewhat limited due to lack of IV contrast. Mild atherosclerotic calcifications and coronary calcifications are seen. No cardiac enlargement is noted. Mediastinum/Nodes: Thoracic inlet is unremarkable. No hilar  or mediastinal adenopathy is noted. Scattered small mediastinal nodes are again noted and stable. The esophagus shows a small sliding-type hiatal hernia distally. Lungs/Pleura: Lungs are well aerated bilaterally. The previously seen patchy infiltrates have nearly completely resolved with only minimal residual density identified in the inferior aspect of the right upper lobe. Small loculated pleural effusion is noted posteriorly on the left. A small focus of air is noted within likely related to thoracentesis. Minimal scarring is noted in the bases bilaterally. No new focal infiltrate is seen. Upper Abdomen: No acute abnormality. Musculoskeletal: Degenerative changes of the thoracic spine are noted. No acute bony abnormality is seen. IMPRESSION: Near complete resolution of previously seen bilateral infiltrates. Only minimal residual density is noted in the right upper lobe. Small loculated left effusion decreased in size from the prior exam with a small focus of air within likely related to the prior thoracentesis. Stable scarring bilaterally No new acute abnormality is noted. Electronically Signed   By: Inez Catalina M.D.   On: 05/09/2018 13:54       Assessment & Plan:   Problem List Items Addressed This Visit    Anemia    Due to f/u with GI this week for further w/up and evaluation.        Relevant Medications   Ferrous Sulfate Dried (FEOSOL) 200 (65 Fe) MG TABS   CAP (community acquired pneumonia)    Recently admitted and treated as outlined.  Doing better.  Feels better.  CT chest improved.  Some persistent changes and  loculated pleural effusion.  Scheduled a f/u with pulmonary for further evaluation.        Hyperglycemia    Follow met b and a1c.        Hypertension, essential    Blood pressure under good control.  Continue same medication regimen.  Follow pressures.  Follow metabolic panel.        Osteopenia    Discussed bone density results and further treatment.  Will work on weight bearing exercise and calcium with vitamin D.  Follow.            Einar Pheasant, MD

## 2018-05-20 ENCOUNTER — Encounter: Payer: Self-pay | Admitting: Internal Medicine

## 2018-05-20 DIAGNOSIS — D649 Anemia, unspecified: Secondary | ICD-10-CM | POA: Insufficient documentation

## 2018-05-20 NOTE — Assessment & Plan Note (Signed)
Due to f/u with GI this week for further w/up and evaluation.

## 2018-05-20 NOTE — Assessment & Plan Note (Signed)
Discussed bone density results and further treatment.  Will work on weight bearing exercise and calcium with vitamin D.  Follow.

## 2018-05-20 NOTE — Assessment & Plan Note (Signed)
Blood pressure under good control.  Continue same medication regimen.  Follow pressures.  Follow metabolic panel.   

## 2018-05-20 NOTE — Assessment & Plan Note (Signed)
Follow met b and a1c.

## 2018-05-20 NOTE — Assessment & Plan Note (Signed)
Recently admitted and treated as outlined.  Doing better.  Feels better.  CT chest improved.  Some persistent changes and loculated pleural effusion.  Scheduled a f/u with pulmonary for further evaluation.

## 2018-06-12 ENCOUNTER — Ambulatory Visit: Payer: BC Managed Care – PPO | Admitting: Internal Medicine

## 2018-06-12 ENCOUNTER — Encounter: Payer: Self-pay | Admitting: Internal Medicine

## 2018-06-12 VITALS — BP 150/84 | HR 75 | Ht 63.5 in | Wt 114.6 lb

## 2018-06-12 DIAGNOSIS — J181 Lobar pneumonia, unspecified organism: Secondary | ICD-10-CM

## 2018-06-12 DIAGNOSIS — Z7722 Contact with and (suspected) exposure to environmental tobacco smoke (acute) (chronic): Secondary | ICD-10-CM | POA: Diagnosis not present

## 2018-06-12 NOTE — Patient Instructions (Signed)
Avoid second hand smoke.  °

## 2018-06-12 NOTE — Progress Notes (Signed)
Name: Aimee Gomez MRN: [id number] DOB: [birth year]     CONSULTATION DATE: 06/12/2018 REFERRING MD : Nicki Reaper  CHIEF COMPLAINT: Abnormal CT chest  STUDIES:     CXR independently reviewed by Me 03/28/2018 CT chest Independently reviewed by Me today Patchy bilateral pulmonary infiltrates RLL nodule 3.5 x 2.1 cm.   05/09/2018 CT chest independently reviewed by me today Resolution of patchy infiltrates Resolution of lung nodule   HISTORY OF PRESENT ILLNESS: 63 year old pleasant white female seen today for abnormal CT chest  CT chest in October 2019 shows multifocal lobular infiltrates and pneumonia Right upper lobe right middle lobe right lower lobe and left lower lobe Also with left-sided pleural effusion After further review CT chest also shows bronchiectasis  In October 2019, patient developed fevers chills generalized weakness loss of appetite and shortness of breath Patient subsequently went on a cruise but felt really bad Upon completion of the cruise patient had left-sided chest pain with fevers and chills Patient was admitted to [facility] for 5 days Patient received IV antibiotics and oxygen therapy Patient underwent thoracentesis on the left side 10 cc was removed  Since discharge patient feels 100% better Patient had a subsequent repeat CT chest December 2019 Which shows complete resolution of the opacifications and pneumonia with residual left-sided lung base scarring and effusion  Patient feels great No Cough  no fevers No shortness of breath No chills  No signs of infection at this time  At this time patient has extensive secondhand smoke exposure for approximately 40 years from her husband   CT of the chest and scans and images were reviewed with the patient      PAST MEDICAL HISTORY :   has a past medical history of Allergy, Colon polyps, Hearing loss, sensorineural (1993), HSV-1 (herpes simplex virus 1) infection, Hypertension, Osteopenia, and  Postmenopausal.  has a past surgical history that includes Breast biopsy (1977). Prior to Admission medications   Medication Sig Start Date End Date Taking? Authorizing Provider  Calcium Citrate (CITRACAL PO) Take 1 tablet by mouth daily.   Yes [provider]  Ferrous Sulfate Dried (FEOSOL) 200 (65 Fe) MG TABS    Yes [provider]  ibuprofen (ADVIL,MOTRIN) 400 MG tablet Take 1 tablet (400 mg total) by mouth 2 (two) times daily. 04/02/18  Yes Wieting, Richard, MD  lisinopril (PRINIVIL,ZESTRIL) 10 MG tablet TAKE 1 TABLET BY MOUTH EVERY DAY 01/07/18  Yes Einar Pheasant, MD  Omega-3 Fatty Acids (FISH OIL) 600 MG CAPS Take 1 tablet by mouth daily.   Yes [provider]  triamcinolone (NASACORT) 55 MCG/ACT nasal inhaler Place 2 sprays into the nose daily.   Yes [provider]   No Known Allergies  FAMILY HISTORY:  family history includes Diabetes in her father; Heart disease in her father. SOCIAL HISTORY:  reports that she quit smoking about 39 years ago. Her smoking use included cigarettes. She started smoking about 47 years ago. She smoked 0.50 packs per day. She has never used smokeless tobacco. She reports current alcohol use. She reports that she does not use drugs.  REVIEW OF SYSTEMS:   Constitutional: Negative for fever, chills, weight loss, malaise/fatigue and diaphoresis.  HENT: Negative for hearing loss, ear pain, nosebleeds, congestion, sore throat, neck pain, tinnitus and ear discharge.   Eyes: Negative for blurred vision, double vision, photophobia, pain, discharge and redness.  Respiratory: Negative for cough, hemoptysis, sputum production, shortness of breath, wheezing and stridor.   Cardiovascular: Negative for chest pain, palpitations,  orthopnea, claudication, leg swelling and PND.  Gastrointestinal: Negative for heartburn, nausea, vomiting, abdominal pain, diarrhea, constipation, blood in stool and melena.  Genitourinary: Negative for  dysuria, urgency, frequency, hematuria and flank pain.  Musculoskeletal: Negative for myalgias, back pain, joint pain and falls.  Skin: Negative for itching and rash.  Neurological: Negative for dizziness, tingling, tremors, sensory change, speech change, focal weakness, seizures, loss of consciousness, weakness and headaches.  Endo/Heme/Allergies: Negative for environmental allergies and polydipsia. Does not bruise/bleed easily.  ALL OTHER ROS ARE NEGATIVE   BP (!) 150/84 (BP Location: Left Arm, Cuff Size: Normal)   Pulse 75   Ht 5' 3.5" (1.613 m)   Wt 114 lb 9.6 oz (52 kg)   LMP 07/26/2003   SpO2 99%   BMI 19.98 kg/m    Physical Examination:   GENERAL:NAD, no fevers, chills, no weakness no fatigue HEAD: Normocephalic, atraumatic.  EYES: Pupils equal, round, reactive to light. Extraocular muscles intact. No scleral icterus.  MOUTH: Moist mucosal membrane.   EAR, NOSE, THROAT: Clear without exudates. No external lesions.  NECK: Supple. No thyromegaly. No nodules. No JVD.  PULMONARY:CTA B/L no wheezes, no crackles, no rhonchi CARDIOVASCULAR: S1 and S2. Regular rate and rhythm. No murmurs, rubs, or gallops. No edema.  GASTROINTESTINAL: Soft, nontender, nondistended. No masses. Positive bowel sounds.  MUSCULOSKELETAL: No swelling, clubbing, or edema. Range of motion full in all extremities.  NEUROLOGIC: Cranial nerves II through XII are intact. No gross focal neurological deficits.  SKIN: No ulceration, lesions, rashes, or cyanosis. Skin warm and dry. Turgor intact.  PSYCHIATRIC: Mood, affect within normal limits. The patient is awake, alert and oriented x 3. Insight, judgment intact.      ASSESSMENT / PLAN: 63 year old pleasant white female seen today for abnormal CT chest consistent with multifocal multilobular pneumonia with pleural effusion with resolution of symptoms and opacifications with repeat CT chest.  At this time patient does not need any type of pulmonary function  testing She does not need steroids at this Time she does not need antibiotics at this time No indication for inhaler therapy at this time  I do strongly recommend that she avoid secondhand smoke exposure Patient is at risk for developing COPD in the future Patient does have evidence of bronchiectasis but no symptoms at this time   Follow-up as needed    Patient satisfied with Plan of action and management. All questions answered  Corrin Parker, M.D.  [facility]  Medical Director [place] Director [facility] Cardio-Pulmonary Department

## 2018-06-28 ENCOUNTER — Other Ambulatory Visit: Payer: Self-pay | Admitting: Internal Medicine

## 2018-07-10 ENCOUNTER — Encounter: Admission: RE | Payer: Self-pay | Source: Home / Self Care

## 2018-07-10 ENCOUNTER — Ambulatory Visit
Admission: RE | Admit: 2018-07-10 | Payer: BC Managed Care – PPO | Source: Home / Self Care | Admitting: Unknown Physician Specialty

## 2018-07-10 SURGERY — COLONOSCOPY WITH PROPOFOL
Anesthesia: General

## 2018-09-26 ENCOUNTER — Encounter: Payer: Self-pay | Admitting: Internal Medicine

## 2018-09-26 ENCOUNTER — Ambulatory Visit (INDEPENDENT_AMBULATORY_CARE_PROVIDER_SITE_OTHER): Payer: BC Managed Care – PPO | Admitting: Internal Medicine

## 2018-09-26 DIAGNOSIS — J189 Pneumonia, unspecified organism: Secondary | ICD-10-CM

## 2018-09-26 DIAGNOSIS — D649 Anemia, unspecified: Secondary | ICD-10-CM

## 2018-09-26 DIAGNOSIS — Z86010 Personal history of colonic polyps: Secondary | ICD-10-CM

## 2018-09-26 DIAGNOSIS — R739 Hyperglycemia, unspecified: Secondary | ICD-10-CM

## 2018-09-26 DIAGNOSIS — I10 Essential (primary) hypertension: Secondary | ICD-10-CM

## 2018-09-26 DIAGNOSIS — K5900 Constipation, unspecified: Secondary | ICD-10-CM

## 2018-09-26 NOTE — Progress Notes (Addendum)
[name], female   DOB: [birth year], 63 y.o.   MRN: [id number] Virtual Visit via telephone Note  This visit type was conducted due to national recommendations for restrictions regarding the COVID-19 pandemic (e.g. social distancing).  This format is felt to be most appropriate for this patient at this time.  All issues noted in this document were discussed and addressed.  No physical exam was performed (except for noted visual exam findings with Video Visits).   I connected with Vernard Gambles by telephone and verified that I am speaking with the correct person using two identifiers. Location patient: home Location provider: work Persons participating in the virtual visit: patient, provider  I discussed the limitations, risks, security and privacy concerns of performing an evaluation and management service by telephone and the availability of in person appointments. The patient expressed understanding and agreed to proceed.   Reason for visit: scheduled follow up.   HPI: She was evaluated recently for pneumonia.  Had f/u with pulmonary - to f/u on CT scan, etc.  Saw Dr Mortimer Fries.  Felt no further w/up or f/u scanning warranted.  Pt states she is doing well.  Feels good.  States will occasionally notice a dull sensation involving left rib cage.  Only notices occasionally when she sneezes.  She has been exercising q day.  No chest pain.  No sob.  Trying to stay in.  No known COVID exposure.  No fever.  No cough or congestion.  No significant acid reflux.  No abdominal pain.  Bowels moving.  States since eating more kale, bowels have been more regular.  She has been taking iron.  Has been off for two weeks.  Was questioning if needed to continue to take.  Had colonoscopy 07/2018.  Recommended f/u in 5 years.  States blood pressures have been averaging 220U systolic.  Overall feels good.     ROS: See pertinent positives and negatives per HPI.  Past Medical History:  Diagnosis Date  .  Allergy   . Colon polyps   . Hearing loss, sensorineural 1993  . HSV-1 (herpes simplex virus 1) infection   . Hypertension   . Osteopenia   . Postmenopausal     Past Surgical History:  Procedure Laterality Date  . BREAST BIOPSY  1977    Family History  Problem Relation Age of Onset  . Heart disease Father        CHF  . Diabetes Father   . Breast cancer Neg Hx     SOCIAL HX: reviewed.    Current Outpatient Medications:  .  Calcium Citrate (CITRACAL PO), Take 1 tablet by mouth daily., Disp: , Rfl:  .  Ferrous Sulfate Dried (FEOSOL) 200 (65 Fe) MG TABS, , Disp: , Rfl:  .  ibuprofen (ADVIL,MOTRIN) 400 MG tablet, Take 1 tablet (400 mg total) by mouth 2 (two) times daily., Disp: 60 tablet, Rfl: 0 .  lisinopril (PRINIVIL,ZESTRIL) 10 MG tablet, TAKE 1 TABLET BY MOUTH EVERY DAY, Disp: 90 tablet, Rfl: 1 .  Omega-3 Fatty Acids (FISH OIL) 600 MG CAPS, Take 1 tablet by mouth daily., Disp: , Rfl:  .  triamcinolone (NASACORT) 55 MCG/ACT nasal inhaler, Place 2 sprays into the nose daily., Disp: , Rfl:   EXAM:  VITALS per patient if applicable: blood pressure yesterday 118/63  GENERAL: alert.  Answering questions appropriately.  Sounds to be in no acute distress.    PSYCH/NEURO: pleasant and cooperative, no obvious depression or anxiety, speech and thought processing  grossly intact  ASSESSMENT AND PLAN:  Discussed the following assessment and plan:  Anemia, unspecified type - Plan: Ferritin, CBC with Differential/Platelet  Community acquired pneumonia, unspecified laterality  Constipation, unspecified constipation type  History of colonic polyps  Hyperglycemia - Plan: Comprehensive metabolic panel  Hypertension, essential - Plan: TSH, Lipid panel  Anemia Has been evaluated by GI.  Had colonoscopy 07/2018.  States due f/u in 5 years.  Off iron x 2 weeks.  Recheck cbc and ferritin.    CAP (community acquired pneumonia) Previously admitted.  Treated.  Had CT chest - improved.   Saw pulmonary.  Felt no further scanning or w/up warranted.  Pt without symptoms.  Follow.    Constipation Eating more kale.  Bowels more regular now.  Not an issue now.    History of colonic polyps States had colonoscopy 07/2018.  Reports no polyps.  Recommended f/u in 5 years.  Need report.    Hyperglycemia Follow met b and a1c.    Hypertension, essential She has been checking her pressures as outlined.  Appears to be doing well.  Follow pressures.      I discussed the assessment and treatment plan with the patient. The patient was provided an opportunity to ask questions and all were answered. The patient agreed with the plan and demonstrated an understanding of the instructions.   The patient was advised to call back or seek an in-person evaluation if the symptoms worsen or if the condition fails to improve as anticipated.  I provided 15 minutes of non-face-to-face time during this encounter.   Einar Pheasant, MD

## 2018-09-29 ENCOUNTER — Encounter: Payer: Self-pay | Admitting: Internal Medicine

## 2018-09-29 NOTE — Assessment & Plan Note (Signed)
Follow met b and a1c.

## 2018-09-29 NOTE — Assessment & Plan Note (Signed)
Previously admitted.  Treated.  Had CT chest - improved.  Saw pulmonary.  Felt no further scanning or w/up warranted.  Pt without symptoms.  Follow.

## 2018-09-29 NOTE — Assessment & Plan Note (Signed)
States had colonoscopy 07/2018.  Reports no polyps.  Recommended f/u in 5 years.  Need report.

## 2018-09-29 NOTE — Assessment & Plan Note (Signed)
Eating more kale.  Bowels more regular now.  Not an issue now.

## 2018-09-29 NOTE — Assessment & Plan Note (Signed)
Has been evaluated by GI.  Had colonoscopy 07/2018.  States due f/u in 5 years.  Off iron x 2 weeks.  Recheck cbc and ferritin.

## 2018-09-29 NOTE — Assessment & Plan Note (Signed)
She has been checking her pressures as outlined.  Appears to be doing well.  Follow pressures.

## 2018-12-25 ENCOUNTER — Other Ambulatory Visit: Payer: Self-pay | Admitting: Internal Medicine

## 2019-01-02 ENCOUNTER — Other Ambulatory Visit: Payer: BC Managed Care – PPO

## 2019-01-07 ENCOUNTER — Other Ambulatory Visit: Payer: Self-pay

## 2019-01-07 ENCOUNTER — Other Ambulatory Visit (INDEPENDENT_AMBULATORY_CARE_PROVIDER_SITE_OTHER): Payer: BC Managed Care – PPO

## 2019-01-07 DIAGNOSIS — R739 Hyperglycemia, unspecified: Secondary | ICD-10-CM

## 2019-01-07 DIAGNOSIS — D649 Anemia, unspecified: Secondary | ICD-10-CM

## 2019-01-07 DIAGNOSIS — I10 Essential (primary) hypertension: Secondary | ICD-10-CM | POA: Diagnosis not present

## 2019-01-07 LAB — LIPID PANEL
Cholesterol: 241 mg/dL — ABNORMAL HIGH (ref 0–200)
HDL: 82.9 mg/dL (ref >39.00)
LDL Cholesterol: 141 mg/dL — ABNORMAL HIGH (ref 0–99)
NonHDL: 158.09
Total CHOL/HDL Ratio: 3
Triglycerides: 87 mg/dL (ref 0.0–149.0)
VLDL: 17.4 mg/dL (ref 0.0–40.0)

## 2019-01-07 LAB — CBC WITH DIFFERENTIAL/PLATELET
Basophils Absolute: 0 10*3/uL (ref 0.0–0.1)
Basophils Relative: 0.5 % (ref 0.0–3.0)
Eosinophils Absolute: 0.3 10*3/uL (ref 0.0–0.7)
Eosinophils Relative: 4.3 % (ref 0.0–5.0)
HCT: 42.3 % (ref 36.0–46.0)
Hemoglobin: 13.9 g/dL (ref 12.0–15.0)
Lymphocytes Relative: 36.7 % (ref 12.0–46.0)
Lymphs Abs: 2.2 10*3/uL (ref 0.7–4.0)
MCHC: 32.8 g/dL (ref 30.0–36.0)
MCV: 91.6 fl (ref 78.0–100.0)
Monocytes Absolute: 0.4 10*3/uL (ref 0.1–1.0)
Monocytes Relative: 7.4 % (ref 3.0–12.0)
Neutro Abs: 3 10*3/uL (ref 1.4–7.7)
Neutrophils Relative %: 51.1 % (ref 43.0–77.0)
Platelets: 232 10*3/uL (ref 150.0–400.0)
RBC: 4.62 Mil/uL (ref 3.87–5.11)
RDW: 13.3 % (ref 11.5–15.5)
WBC: 5.9 10*3/uL (ref 4.0–10.5)

## 2019-01-07 LAB — COMPREHENSIVE METABOLIC PANEL
ALT: 18 U/L (ref 0–35)
AST: 18 U/L (ref 0–37)
Albumin: 4.3 g/dL (ref 3.5–5.2)
Alkaline Phosphatase: 63 U/L (ref 39–117)
BUN: 19 mg/dL (ref 6–23)
CO2: 32 mEq/L (ref 19–32)
Calcium: 9.9 mg/dL (ref 8.4–10.5)
Chloride: 102 mEq/L (ref 96–112)
Creatinine, Ser: 0.82 mg/dL (ref 0.40–1.20)
GFR: 70.11 mL/min (ref >60.00)
Glucose, Bld: 83 mg/dL (ref 70–99)
Potassium: 4.2 mEq/L (ref 3.5–5.1)
Sodium: 140 mEq/L (ref 135–145)
Total Bilirubin: 0.4 mg/dL (ref 0.2–1.2)
Total Protein: 6.7 g/dL (ref 6.0–8.3)

## 2019-01-07 LAB — FERRITIN: Ferritin: 64.1 ng/mL (ref 10.0–291.0)

## 2019-01-07 LAB — TSH: TSH: 2.91 u[IU]/mL (ref 0.35–4.50)

## 2019-01-09 ENCOUNTER — Encounter: Payer: Self-pay | Admitting: Internal Medicine

## 2019-02-28 ENCOUNTER — Other Ambulatory Visit: Payer: Self-pay

## 2019-02-28 ENCOUNTER — Ambulatory Visit (INDEPENDENT_AMBULATORY_CARE_PROVIDER_SITE_OTHER): Payer: BC Managed Care – PPO

## 2019-02-28 DIAGNOSIS — Z23 Encounter for immunization: Secondary | ICD-10-CM

## 2019-04-04 ENCOUNTER — Encounter: Payer: BC Managed Care – PPO | Admitting: Internal Medicine

## 2019-04-14 ENCOUNTER — Other Ambulatory Visit: Payer: Self-pay

## 2019-04-14 ENCOUNTER — Encounter: Payer: Self-pay | Admitting: Internal Medicine

## 2019-04-14 ENCOUNTER — Other Ambulatory Visit: Payer: Self-pay | Admitting: Internal Medicine

## 2019-04-14 ENCOUNTER — Ambulatory Visit (INDEPENDENT_AMBULATORY_CARE_PROVIDER_SITE_OTHER): Payer: BC Managed Care – PPO | Admitting: Internal Medicine

## 2019-04-14 ENCOUNTER — Other Ambulatory Visit (HOSPITAL_COMMUNITY)
Admission: RE | Admit: 2019-04-14 | Discharge: 2019-04-14 | Disposition: A | Discharge status: Home / Self Care | Payer: BC Managed Care – PPO | Source: Ambulatory Visit | Attending: Internal Medicine | Admitting: Internal Medicine

## 2019-04-14 VITALS — BP 126/78 | HR 65 | Temp 97.7°F | Resp 16 | Ht 64.0 in | Wt 115.2 lb

## 2019-04-14 DIAGNOSIS — D649 Anemia, unspecified: Secondary | ICD-10-CM

## 2019-04-14 DIAGNOSIS — R739 Hyperglycemia, unspecified: Secondary | ICD-10-CM

## 2019-04-14 DIAGNOSIS — Z0000 Encounter for general adult medical examination without abnormal findings: Secondary | ICD-10-CM | POA: Diagnosis not present

## 2019-04-14 DIAGNOSIS — Z124 Encounter for screening for malignant neoplasm of cervix: Secondary | ICD-10-CM | POA: Insufficient documentation

## 2019-04-14 DIAGNOSIS — I10 Essential (primary) hypertension: Secondary | ICD-10-CM

## 2019-04-14 DIAGNOSIS — Z1231 Encounter for screening mammogram for malignant neoplasm of breast: Secondary | ICD-10-CM

## 2019-04-14 LAB — CBC WITH DIFFERENTIAL/PLATELET
Basophils Absolute: 0 10*3/uL (ref 0.0–0.1)
Basophils Relative: 0.6 % (ref 0.0–3.0)
Eosinophils Absolute: 0.1 10*3/uL (ref 0.0–0.7)
Eosinophils Relative: 2.4 % (ref 0.0–5.0)
HCT: 41 % (ref 36.0–46.0)
Hemoglobin: 13.6 g/dL (ref 12.0–15.0)
Lymphocytes Relative: 29.6 % (ref 12.0–46.0)
Lymphs Abs: 1.5 10*3/uL (ref 0.7–4.0)
MCHC: 33.2 g/dL (ref 30.0–36.0)
MCV: 90.8 fl (ref 78.0–100.0)
Monocytes Absolute: 0.4 10*3/uL (ref 0.1–1.0)
Monocytes Relative: 7.4 % (ref 3.0–12.0)
Neutro Abs: 3.1 10*3/uL (ref 1.4–7.7)
Neutrophils Relative %: 60 % (ref 43.0–77.0)
Platelets: 229 10*3/uL (ref 150.0–400.0)
RBC: 4.52 Mil/uL (ref 3.87–5.11)
RDW: 13.8 % (ref 11.5–15.5)
WBC: 5.2 10*3/uL (ref 4.0–10.5)

## 2019-04-14 LAB — LIPID PANEL
Cholesterol: 212 mg/dL — ABNORMAL HIGH (ref 0–200)
HDL: 67.3 mg/dL (ref >39.00)
LDL Cholesterol: 128 mg/dL — ABNORMAL HIGH (ref 0–99)
NonHDL: 145.1
Total CHOL/HDL Ratio: 3
Triglycerides: 85 mg/dL (ref 0.0–149.0)
VLDL: 17 mg/dL (ref 0.0–40.0)

## 2019-04-14 LAB — TSH: TSH: 2.25 u[IU]/mL (ref 0.35–4.50)

## 2019-04-14 LAB — COMPREHENSIVE METABOLIC PANEL
ALT: 18 U/L (ref 0–35)
AST: 23 U/L (ref 0–37)
Albumin: 4.2 g/dL (ref 3.5–5.2)
Alkaline Phosphatase: 67 U/L (ref 39–117)
BUN: 20 mg/dL (ref 6–23)
CO2: 30 mEq/L (ref 19–32)
Calcium: 9.3 mg/dL (ref 8.4–10.5)
Chloride: 102 mEq/L (ref 96–112)
Creatinine, Ser: 0.81 mg/dL (ref 0.40–1.20)
GFR: 71.05 mL/min (ref >60.00)
Glucose, Bld: 80 mg/dL (ref 70–99)
Potassium: 4 mEq/L (ref 3.5–5.1)
Sodium: 139 mEq/L (ref 135–145)
Total Bilirubin: 0.4 mg/dL (ref 0.2–1.2)
Total Protein: 6.9 g/dL (ref 6.0–8.3)

## 2019-04-14 LAB — FERRITIN: Ferritin: 69.4 ng/mL (ref 10.0–291.0)

## 2019-04-14 NOTE — Assessment & Plan Note (Signed)
Follow met b and a1c.

## 2019-04-14 NOTE — Assessment & Plan Note (Signed)
Physical today 04/14/19.  PAP 04/14/19.  Mammogram 04/24/18 - Birads I.  Mammogram ordered.  She will call to schedule.  Colonoscopy 07/2018.  Recommended f/u colonoscopy in 5 years.

## 2019-04-14 NOTE — Progress Notes (Signed)
[name], female   DOB: 1956-04-05, 63 y.o.   MRN: [id number]   Subjective:    [name], female    DOB: July 31, 1955, 63 y.o.   MRN: [id number]  HPI  Patient here for her physical exam.  She reports she is doing well.   Feels good.  Is exercising.  No chest pain.  No sob.  Saw Dr Mortimer Fries previously.  Breathing stable.  No sob. No cough or congestion.  No acid reflux.  No abdominal pain.  Bowels moving.  Just had colonoscopy 07/2018.  Recommended f/u in 5 years.    Past Medical History:  Diagnosis Date   Allergy    Colon polyps    Hearing loss, sensorineural 1993   HSV-1 (herpes simplex virus 1) infection    Hypertension    Osteopenia    Postmenopausal    Past Surgical History:  Procedure Laterality Date   BREAST BIOPSY  1977   Family History  Problem Relation Age of Onset   Heart disease Father        CHF   Diabetes Father    Breast cancer Neg Hx    Social History   Socioeconomic History   Marital status: Married    Spouse name: Not on file   Number of children: Not on file   Years of education: Not on file   Highest education level: Not on file  Occupational History   Not on file  Social Needs   Financial resource strain: Not on file   Food insecurity    Worry: Not on file    Inability: Not on file   Transportation needs    Medical: Not on file    Non-medical: Not on file  Tobacco Use   Smoking status: Former Smoker    Packs/day: 0.50    Types: Cigarettes    Start date: 49    Quit date: 1981    Years since quitting: 39.8   Smokeless tobacco: Never Used  Substance and Sexual Activity   Alcohol use: Yes    Alcohol/week: 0.0 standard drinks   Drug use: No   Sexual activity: Not on file  Lifestyle   Physical activity    Days per week: Not on file    Minutes per session: Not on file   Stress: Not on file  Relationships   Social connections    Talks on phone: Not on file    Gets together:  Not on file    Attends religious service: Not on file    Active member of club or organization: Not on file    Attends meetings of clubs or organizations: Not on file    Relationship status: Not on file  Other Topics Concern   Not on file  Social History Narrative   Not on file    Outpatient Encounter Medications as of 04/14/2019  Medication Sig   Calcium Citrate (CITRACAL PO) Take 1 tablet by mouth daily.   ibuprofen (ADVIL,MOTRIN) 400 MG tablet Take 1 tablet (400 mg total) by mouth 2 (two) times daily.   lisinopril (ZESTRIL) 10 MG tablet TAKE 1 TABLET BY MOUTH EVERY DAY   Omega-3 Fatty Acids (FISH OIL) 600 MG CAPS Take 1 tablet by mouth daily.   triamcinolone (NASACORT) 55 MCG/ACT nasal inhaler Place 2 sprays into the nose daily.   [DISCONTINUED] Ferrous Sulfate Dried (FEOSOL) 200 (65 Fe) MG TABS    No facility-administered encounter medications on file as of 04/14/2019.  Review of Systems  Constitutional: Negative for appetite change and unexpected weight change.  HENT: Negative for congestion and sinus pressure.   Eyes: Negative for pain and visual disturbance.  Respiratory: Negative for cough, chest tightness and shortness of breath.   Cardiovascular: Negative for chest pain, palpitations and leg swelling.  Gastrointestinal: Negative for abdominal pain, diarrhea, nausea and vomiting.  Genitourinary: Negative for difficulty urinating and dysuria.  Musculoskeletal: Negative for joint swelling and myalgias.  Skin: Negative for color change and rash.  Neurological: Negative for dizziness, light-headedness and headaches.  Hematological: Negative for adenopathy. Does not bruise/bleed easily.  Psychiatric/Behavioral: Negative for agitation and dysphoric mood.       Objective:    Physical Exam Constitutional:      General: She is not in acute distress.    Appearance: Normal appearance. She is well-developed.  HENT:     Head: Normocephalic and atraumatic.     Right  Ear: External ear normal.     Left Ear: External ear normal.  Eyes:     General: No scleral icterus.       Right eye: No discharge.        Left eye: No discharge.     Conjunctiva/sclera: Conjunctivae normal.  Neck:     Musculoskeletal: Neck supple. No muscular tenderness.     Thyroid: No thyromegaly.  Cardiovascular:     Rate and Rhythm: Normal rate and regular rhythm.  Pulmonary:     Effort: No tachypnea, accessory muscle usage or respiratory distress.     Breath sounds: Normal breath sounds. No decreased breath sounds or wheezing.  Chest:     Breasts:        Right: No inverted nipple, mass, nipple discharge or tenderness (no axillary adenopathy).        Left: No inverted nipple, mass, nipple discharge or tenderness (no axilarry adenopathy).  Abdominal:     General: Bowel sounds are normal.     Palpations: Abdomen is soft.     Tenderness: There is no abdominal tenderness.  Genitourinary:    Comments: Normal external genitalia.  Vaginal vault without lesions.  Cervix identified.  Atrophy changes noted.  Stenotic cervical os. Pap smear performed.  Could not appreciate any adnexal masses or tenderness.   Musculoskeletal:        General: No tenderness.  Lymphadenopathy:     Cervical: No cervical adenopathy.  Skin:    General: Skin is warm.     Findings: No rash.  Neurological:     Mental Status: She is alert and oriented to person, place, and time.  Psychiatric:        Mood and Affect: Mood normal.        Behavior: Behavior normal.     BP 126/78    Pulse 65    Temp 97.7 F (36.5 C)    Resp 16    Wt 115 lb 3.2 oz (52.3 kg)    LMP 07/26/2003    SpO2 99%    BMI 20.09 kg/m  Wt Readings from Last 3 Encounters:  04/14/19 115 lb 3.2 oz (52.3 kg)  06/12/18 114 lb 9.6 oz (52 kg)  05/14/18 114 lb (51.7 kg)     Lab Results  Component Value Date   WBC 5.2 04/14/2019   HGB 13.6 04/14/2019   HCT 41.0 04/14/2019   PLT 229.0 04/14/2019   GLUCOSE 80 04/14/2019   CHOL 212 (H)  04/14/2019   TRIG 85.0 04/14/2019   HDL 67.30 04/14/2019  LDLDIRECT 113.2 08/07/2012   LDLCALC 128 (H) 04/14/2019   ALT 18 04/14/2019   AST 23 04/14/2019   NA 139 04/14/2019   K 4.0 04/14/2019   CL 102 04/14/2019   CREATININE 0.81 04/14/2019   BUN 20 04/14/2019   CO2 30 04/14/2019   TSH 2.25 04/14/2019   HGBA1C 5.9 05/22/2017    Ct Chest Wo Contrast  Result Date: 05/09/2018 CLINICAL DATA:  History of multifocal pneumonia and left-sided pleural effusion, follow-up exam EXAM: CT CHEST WITHOUT CONTRAST TECHNIQUE: Multidetector CT imaging of the chest was performed following the standard protocol without IV contrast. COMPARISON:  03/28/2018 FINDINGS: Cardiovascular: Somewhat limited due to lack of IV contrast. Mild atherosclerotic calcifications and coronary calcifications are seen. No cardiac enlargement is noted. Mediastinum/Nodes: Thoracic inlet is unremarkable. No hilar or mediastinal adenopathy is noted. Scattered small mediastinal nodes are again noted and stable. The esophagus shows a small sliding-type hiatal hernia distally. Lungs/Pleura: Lungs are well aerated bilaterally. The previously seen patchy infiltrates have nearly completely resolved with only minimal residual density identified in the inferior aspect of the right upper lobe. Small loculated pleural effusion is noted posteriorly on the left. A small focus of air is noted within likely related to thoracentesis. Minimal scarring is noted in the bases bilaterally. No new focal infiltrate is seen. Upper Abdomen: No acute abnormality. Musculoskeletal: Degenerative changes of the thoracic spine are noted. No acute bony abnormality is seen. IMPRESSION: Near complete resolution of previously seen bilateral infiltrates. Only minimal residual density is noted in the right upper lobe. Small loculated left effusion decreased in size from the prior exam with a small focus of air within likely related to the prior thoracentesis. Stable scarring  bilaterally No new acute abnormality is noted. Electronically Signed   By: Inez Catalina M.D.   On: 05/09/2018 13:54       Assessment & Plan:   Problem List Items Addressed This Visit    Anemia    Had colonoscopy 07/2018.  Recommended f/u in 5 years.  Follow cbc and ferritin.        Relevant Orders   CBC with Differential/Platelet (Completed)   Ferritin (Completed)   Health care maintenance    Physical today 04/14/19.  PAP 04/14/19.  Mammogram 04/24/18 - Birads I.  Mammogram ordered.  She will call to schedule.  Colonoscopy 07/2018.  Recommended f/u colonoscopy in 5 years.        Hyperglycemia    Follow met b and a1c.       Hypertension, essential    Blood pressure under good control.  Continue same medication regimen.  Follow pressures.  Follow metabolic panel.        Relevant Orders   Comprehensive metabolic panel (Completed)   TSH (Completed)   Lipid panel (Completed)    Other Visit Diagnoses    Encounter for screening mammogram for malignant neoplasm of breast    -  Primary   Relevant Orders   MM 3D SCREEN BREAST BILATERAL   Cervical cancer screening       Relevant Orders   Cytology - PAP( [facility])       Einar Pheasant, MD

## 2019-04-14 NOTE — Assessment & Plan Note (Signed)
Had colonoscopy 07/2018.  Recommended f/u in 5 years.  Follow cbc and ferritin.

## 2019-04-14 NOTE — Assessment & Plan Note (Signed)
Blood pressure under good control.  Continue same medication regimen.  Follow pressures.  Follow metabolic panel.   

## 2019-04-16 ENCOUNTER — Encounter: Payer: Self-pay | Admitting: Internal Medicine

## 2019-04-16 LAB — CYTOLOGY - PAP
Comment: NEGATIVE
Diagnosis: NEGATIVE
High risk HPV: NEGATIVE

## 2019-04-25 ENCOUNTER — Ambulatory Visit
Admission: RE | Admit: 2019-04-25 | Discharge: 2019-04-25 | Disposition: A | Discharge status: Home / Self Care | Payer: BC Managed Care – PPO | Source: Ambulatory Visit | Attending: Internal Medicine | Admitting: Internal Medicine

## 2019-04-25 DIAGNOSIS — Z1231 Encounter for screening mammogram for malignant neoplasm of breast: Secondary | ICD-10-CM | POA: Insufficient documentation

## 2019-06-18 ENCOUNTER — Other Ambulatory Visit: Payer: Self-pay | Admitting: Internal Medicine

## 2019-10-14 ENCOUNTER — Ambulatory Visit (INDEPENDENT_AMBULATORY_CARE_PROVIDER_SITE_OTHER): Payer: Medicare Other | Admitting: Internal Medicine

## 2019-10-14 ENCOUNTER — Other Ambulatory Visit: Payer: Self-pay

## 2019-10-14 ENCOUNTER — Encounter: Payer: Self-pay | Admitting: Internal Medicine

## 2019-10-14 VITALS — BP 126/74 | HR 65 | Temp 97.7°F | Resp 14 | Ht 64.0 in | Wt 113.8 lb

## 2019-10-14 DIAGNOSIS — I10 Essential (primary) hypertension: Secondary | ICD-10-CM

## 2019-10-14 DIAGNOSIS — Z86010 Personal history of colonic polyps: Secondary | ICD-10-CM

## 2019-10-14 LAB — COMPREHENSIVE METABOLIC PANEL
ALT: 19 U/L (ref 0–35)
AST: 22 U/L (ref 0–37)
Albumin: 4.1 g/dL (ref 3.5–5.2)
Alkaline Phosphatase: 63 U/L (ref 39–117)
BUN: 19 mg/dL (ref 6–23)
CO2: 30 mEq/L (ref 19–32)
Calcium: 9.3 mg/dL (ref 8.4–10.5)
Chloride: 104 mEq/L (ref 96–112)
Creatinine, Ser: 0.81 mg/dL (ref 0.40–1.20)
GFR: 70.93 mL/min (ref >60.00)
Glucose, Bld: 89 mg/dL (ref 70–99)
Potassium: 4.3 mEq/L (ref 3.5–5.1)
Sodium: 139 mEq/L (ref 135–145)
Total Bilirubin: 0.4 mg/dL (ref 0.2–1.2)
Total Protein: 6.5 g/dL (ref 6.0–8.3)

## 2019-10-14 LAB — LIPID PANEL
Cholesterol: 213 mg/dL — ABNORMAL HIGH (ref 0–200)
HDL: 72 mg/dL (ref >39.00)
LDL Cholesterol: 126 mg/dL — ABNORMAL HIGH (ref 0–99)
NonHDL: 141.09
Total CHOL/HDL Ratio: 3
Triglycerides: 75 mg/dL (ref 0.0–149.0)
VLDL: 15 mg/dL (ref 0.0–40.0)

## 2019-10-14 NOTE — Assessment & Plan Note (Signed)
Blood pressure as outlined.  Continue lisinopril.  Check metabolic panel.

## 2019-10-14 NOTE — Patient Instructions (Signed)
prevnar - (pneumonia shot)

## 2019-10-14 NOTE — Progress Notes (Signed)
[name], female   DOB: 1956/05/06, 65 y.o.   MRN: [id number]   Subjective:    [name], female    DOB: March 20, 1956, 64 y.o.   MRN: [id number]  HPI This visit occurred during the SARS-CoV-2 public health emergency.  Safety protocols were in place, including screening questions prior to the visit, additional usage of staff PPE, and extensive cleaning of exam room while observing appropriate contact time as indicated for disinfecting solutions.  Patient here for a scheduled follow up.  She reports she is doing well.  Feels good.  Stays active.  Walks daily and does exercise class through zoom.  No chest pain or sob reported.  No abdominal pain.  Bowels moving.  Only rarely has to take something for her bowels.  Discussed covid and covid vaccine. She had pfizer.  Discussed pneumonia vaccine.    Past Medical History:  Diagnosis Date  . Allergy   . Colon polyps   . Hearing loss, sensorineural 1993  . HSV-1 (herpes simplex virus 1) infection   . Hypertension   . Osteopenia   . Postmenopausal    Past Surgical History:  Procedure Laterality Date  . BREAST BIOPSY  1977   Family History  Problem Relation Age of Onset  . Heart disease Father        CHF  . Diabetes Father   . Breast cancer Neg Hx    Social History   Socioeconomic History  . Marital status: Married    Spouse name: Not on file  . Number of children: Not on file  . Years of education: Not on file  . Highest education level: Not on file  Occupational History  . Not on file  Tobacco Use  . Smoking status: Former Smoker    Packs/day: 0.50    Types: Cigarettes    Start date: 40    Quit date: 1981    Years since quitting: 40.3  . Smokeless tobacco: Never Used  Substance and Sexual Activity  . Alcohol use: Yes    Alcohol/week: 0.0 standard drinks  . Drug use: No  . Sexual activity: Not on file  Other Topics Concern  . Not on file  Social History Narrative  . Not on file    Social Determinants of Health   Financial Resource Strain:   . Difficulty of Paying Living Expenses:   Food Insecurity:   . Worried About Charity fundraiser in the Last Year:   . Arboriculturist in the Last Year:   Transportation Needs:   . Film/video editor (Medical):   Marland Kitchen Lack of Transportation (Non-Medical):   Physical Activity:   . Days of Exercise per Week:   . Minutes of Exercise per Session:   Stress:   . Feeling of Stress :   Social Connections:   . Frequency of Communication with Friends and Family:   . Frequency of Social Gatherings with Friends and Family:   . Attends Religious Services:   . Active Member of Clubs or Organizations:   . Attends Archivist Meetings:   Marland Kitchen Marital Status:     Outpatient Encounter Medications as of 10/14/2019  Medication Sig  . Calcium Citrate (CITRACAL PO) Take 1 tablet by mouth daily.  Marland Kitchen ibuprofen (ADVIL,MOTRIN) 400 MG tablet Take 1 tablet (400 mg total) by mouth 2 (two) times daily.  Marland Kitchen lisinopril (ZESTRIL) 10 MG tablet TAKE 1 TABLET BY MOUTH EVERY DAY  . Omega-3  Fatty Acids (FISH OIL) 600 MG CAPS Take 1 tablet by mouth daily.  Marland Kitchen triamcinolone (NASACORT) 55 MCG/ACT nasal inhaler Place 2 sprays into the nose daily.   No facility-administered encounter medications on file as of 10/14/2019.    Review of Systems  Constitutional: Negative for appetite change and unexpected weight change.  HENT: Negative for congestion and sinus pressure.   Respiratory: Negative for cough, chest tightness and shortness of breath.   Cardiovascular: Negative for chest pain, palpitations and leg swelling.  Gastrointestinal: Negative for abdominal pain, diarrhea, nausea and vomiting.  Genitourinary: Negative for difficulty urinating and dysuria.  Musculoskeletal: Negative for joint swelling and myalgias.  Skin: Negative for color change and rash.  Neurological: Negative for dizziness, light-headedness and headaches.  Psychiatric/Behavioral:  Negative for agitation and dysphoric mood.       Objective:    Physical Exam Constitutional:      General: She is not in acute distress.    Appearance: Normal appearance.  HENT:     Head: Normocephalic and atraumatic.     Right Ear: External ear normal.     Left Ear: External ear normal.  Eyes:     General: No scleral icterus.       Right eye: No discharge.        Left eye: No discharge.     Conjunctiva/sclera: Conjunctivae normal.  Neck:     Thyroid: No thyromegaly.  Cardiovascular:     Rate and Rhythm: Normal rate and regular rhythm.  Pulmonary:     Effort: No respiratory distress.     Breath sounds: Normal breath sounds. No wheezing.  Abdominal:     General: Bowel sounds are normal.     Palpations: Abdomen is soft.     Tenderness: There is no abdominal tenderness.  Musculoskeletal:        General: No swelling or tenderness.     Cervical back: Neck supple. No tenderness.  Lymphadenopathy:     Cervical: No cervical adenopathy.  Skin:    Findings: No erythema or rash.  Neurological:     Mental Status: She is alert.  Psychiatric:        Mood and Affect: Mood normal.        Behavior: Behavior normal.     BP 126/74 (BP Location: Left Arm, Patient Position: Sitting, Cuff Size: Normal)   Pulse 65   Temp 97.7 F (36.5 C) (Temporal)   Resp 14   Ht 5\' 4"  (1.626 m)   Wt 113 lb 12.8 oz (51.6 kg)   LMP 07/26/2003   SpO2 99%   BMI 19.53 kg/m  Wt Readings from Last 3 Encounters:  10/14/19 113 lb 12.8 oz (51.6 kg)  04/14/19 115 lb 3.2 oz (52.3 kg)  06/12/18 114 lb 9.6 oz (52 kg)     Lab Results  Component Value Date   WBC 5.2 04/14/2019   HGB 13.6 04/14/2019   HCT 41.0 04/14/2019   PLT 229.0 04/14/2019   GLUCOSE 80 04/14/2019   CHOL 212 (H) 04/14/2019   TRIG 85.0 04/14/2019   HDL 67.30 04/14/2019   LDLDIRECT 113.2 08/07/2012   LDLCALC 128 (H) 04/14/2019   ALT 18 04/14/2019   AST 23 04/14/2019   NA 139 04/14/2019   K 4.0 04/14/2019   CL 102 04/14/2019     CREATININE 0.81 04/14/2019   BUN 20 04/14/2019   CO2 30 04/14/2019   TSH 2.25 04/14/2019   HGBA1C 5.9 05/22/2017    MM 3D SCREEN BREAST BILATERAL  Result Date: 04/25/2019 CLINICAL DATA:  Screening. EXAM: DIGITAL SCREENING BILATERAL MAMMOGRAM WITH TOMO AND CAD COMPARISON:  Previous exam(s). ACR Breast Density Category c: The breast tissue is heterogeneously dense, which may obscure small masses. FINDINGS: There are no findings suspicious for malignancy. Images were processed with CAD. IMPRESSION: No mammographic evidence of malignancy. A result letter of this screening mammogram will be mailed directly to the patient. RECOMMENDATION: Screening mammogram in one year. (Code:SM-B-01Y) BI-RADS CATEGORY  1: Negative. Electronically Signed   By: Lillia Mountain M.D.   On: 04/25/2019 16:34       Assessment & Plan:   Problem List Items Addressed This Visit    History of colonic polyps    Colonoscopy 07/2018.  Reports no polyps.  Recommended f/u in 5 years.        Hypertension, essential - Primary    Blood pressure as outlined.  Continue lisinopril.  Check metabolic panel.       Relevant Orders   Comprehensive metabolic panel   Lipid panel       Einar Pheasant, MD

## 2019-10-14 NOTE — Assessment & Plan Note (Signed)
Colonoscopy 07/2018.  Reports no polyps.  Recommended f/u in 5 years.   

## 2019-10-15 ENCOUNTER — Encounter: Payer: Self-pay | Admitting: Internal Medicine

## 2019-12-11 ENCOUNTER — Other Ambulatory Visit: Payer: Self-pay | Admitting: Internal Medicine

## 2020-01-23 ENCOUNTER — Encounter: Payer: Self-pay | Admitting: Internal Medicine

## 2020-01-23 ENCOUNTER — Ambulatory Visit (INDEPENDENT_AMBULATORY_CARE_PROVIDER_SITE_OTHER): Payer: Medicare Other | Admitting: Internal Medicine

## 2020-01-23 ENCOUNTER — Other Ambulatory Visit: Payer: Self-pay

## 2020-01-23 VITALS — BP 132/70 | HR 66 | Temp 98.0°F | Resp 16 | Ht 64.0 in | Wt 113.0 lb

## 2020-01-23 DIAGNOSIS — Z1231 Encounter for screening mammogram for malignant neoplasm of breast: Secondary | ICD-10-CM

## 2020-01-23 DIAGNOSIS — M8580 Other specified disorders of bone density and structure, unspecified site: Secondary | ICD-10-CM

## 2020-01-23 DIAGNOSIS — D649 Anemia, unspecified: Secondary | ICD-10-CM

## 2020-01-23 DIAGNOSIS — E2839 Other primary ovarian failure: Secondary | ICD-10-CM

## 2020-01-23 DIAGNOSIS — R739 Hyperglycemia, unspecified: Secondary | ICD-10-CM

## 2020-01-23 DIAGNOSIS — I10 Essential (primary) hypertension: Secondary | ICD-10-CM

## 2020-01-23 NOTE — Progress Notes (Signed)
Patient ID: Conna Terada, female   DOB: 06/24/1955, 64 y.o.   MRN: [id number]   Subjective:    Patient ID: Vella Redhead, female    DOB: 04/05/1956, 64 y.o.   MRN: [id number]  HPI This visit occurred during the SARS-CoV-2 public health emergency.  Safety protocols were in place, including screening questions prior to the visit, additional usage of staff PPE, and extensive cleaning of exam room while observing appropriate contact time as indicated for disinfecting solutions.  Patient here - she was supposed to be scheduled for welcome to medicare.  Mix up in scheduling.  Discussed would schedule with her physical at next visit - will do both.  She is doing well.  Feels good.  Stays active.  Yoga - via zoom.  No chest pain or sob reported.  No abdominal pain or bowel change reported.  Discussed bone density.    Past Medical History:  Diagnosis Date  . Allergy   . Colon polyps   . Hearing loss, sensorineural 1993  . HSV-1 (herpes simplex virus 1) infection   . Hypertension   . Osteopenia   . Postmenopausal    Past Surgical History:  Procedure Laterality Date  . BREAST BIOPSY  1977   Family History  Problem Relation Age of Onset  . Heart disease Father        CHF  . Diabetes Father   . Breast cancer Neg Hx    Social History   Socioeconomic History  . Marital status: Married    Spouse name: Not on file  . Number of children: Not on file  . Years of education: Not on file  . Highest education level: Not on file  Occupational History  . Not on file  Tobacco Use  . Smoking status: Former Smoker    Packs/day: 0.50    Types: Cigarettes    Start date: 64    Quit date: 1981    Years since quitting: 40.6  . Smokeless tobacco: Never Used  Substance and Sexual Activity  . Alcohol use: Yes    Alcohol/week: 0.0 standard drinks  . Drug use: No  . Sexual activity: Not on file  Other Topics Concern  . Not on file  Social History Narrative  . Not on file   Social  Determinants of Health   Financial Resource Strain:   . Difficulty of Paying Living Expenses: Not on file  Food Insecurity:   . Worried About Charity fundraiser in the Last Year: Not on file  . Ran Out of Food in the Last Year: Not on file  Transportation Needs:   . Lack of Transportation (Medical): Not on file  . Lack of Transportation (Non-Medical): Not on file  Physical Activity:   . Days of Exercise per Week: Not on file  . Minutes of Exercise per Session: Not on file  Stress:   . Feeling of Stress : Not on file  Social Connections:   . Frequency of Communication with Friends and Family: Not on file  . Frequency of Social Gatherings with Friends and Family: Not on file  . Attends Religious Services: Not on file  . Active Member of Clubs or Organizations: Not on file  . Attends Archivist Meetings: Not on file  . Marital Status: Not on file    Outpatient Encounter Medications as of 01/23/2020  Medication Sig  . Calcium Citrate (CITRACAL PO) Take 1 tablet by mouth daily.  Marland Kitchen ibuprofen (ADVIL,MOTRIN) 400 MG tablet  Take 1 tablet (400 mg total) by mouth 2 (two) times daily.  Marland Kitchen lisinopril (ZESTRIL) 10 MG tablet TAKE 1 TABLET BY MOUTH EVERY DAY  . Omega-3 Fatty Acids (FISH OIL) 600 MG CAPS Take 1 tablet by mouth daily.  Marland Kitchen triamcinolone (NASACORT) 55 MCG/ACT nasal inhaler Place 2 sprays into the nose daily.   No facility-administered encounter medications on file as of 01/23/2020.    Review of Systems  Constitutional: Negative for appetite change and unexpected weight change.  HENT: Negative for congestion and sinus pressure.   Respiratory: Negative for cough, chest tightness and shortness of breath.   Cardiovascular: Negative for chest pain, palpitations and leg swelling.  Gastrointestinal: Negative for diarrhea, nausea and vomiting.  Genitourinary: Negative for difficulty urinating and dysuria.  Musculoskeletal: Negative for joint swelling and myalgias.  Skin:  Negative for color change and rash.  Neurological: Negative for dizziness, light-headedness and headaches.  Psychiatric/Behavioral: Negative for agitation and dysphoric mood.       Objective:    Physical Exam Vitals reviewed.  Constitutional:      General: She is not in acute distress.    Appearance: Normal appearance.  HENT:     Head: Normocephalic and atraumatic.     Right Ear: External ear normal.     Left Ear: External ear normal.  Eyes:     General: No scleral icterus.       Right eye: No discharge.        Left eye: No discharge.     Conjunctiva/sclera: Conjunctivae normal.  Neck:     Thyroid: No thyromegaly.  Cardiovascular:     Rate and Rhythm: Normal rate and regular rhythm.  Pulmonary:     Effort: No respiratory distress.     Breath sounds: Normal breath sounds. No wheezing.  Abdominal:     General: Bowel sounds are normal.     Palpations: Abdomen is soft.     Tenderness: There is no abdominal tenderness.  Musculoskeletal:        General: No swelling or tenderness.     Cervical back: Neck supple. No tenderness.  Lymphadenopathy:     Cervical: No cervical adenopathy.  Skin:    Findings: No erythema or rash.  Neurological:     Mental Status: She is alert.  Psychiatric:        Mood and Affect: Mood normal.        Behavior: Behavior normal.     BP 132/70   Pulse 66   Temp 98 F (36.7 C) (Oral)   Resp 16   Ht '5\' 4"'  (1.626 m)   Wt 113 lb (51.3 kg)   LMP 07/26/2003   SpO2 99%   BMI 19.40 kg/m  Wt Readings from Last 3 Encounters:  01/23/20 113 lb (51.3 kg)  10/14/19 113 lb 12.8 oz (51.6 kg)  04/14/19 115 lb 3.2 oz (52.3 kg)     Lab Results  Component Value Date   WBC 5.2 04/14/2019   HGB 13.6 04/14/2019   HCT 41.0 04/14/2019   PLT 229.0 04/14/2019   GLUCOSE 89 10/14/2019   CHOL 213 (H) 10/14/2019   TRIG 75.0 10/14/2019   HDL 72.00 10/14/2019   LDLDIRECT 113.2 08/07/2012   LDLCALC 126 (H) 10/14/2019   ALT 19 10/14/2019   AST 22 10/14/2019    NA 139 10/14/2019   K 4.3 10/14/2019   CL 104 10/14/2019   CREATININE 0.81 10/14/2019   BUN 19 10/14/2019   CO2 30 10/14/2019   TSH  2.25 04/14/2019   HGBA1C 5.9 05/22/2017    MM 3D SCREEN BREAST BILATERAL  Result Date: 04/25/2019 CLINICAL DATA:  Screening. EXAM: DIGITAL SCREENING BILATERAL MAMMOGRAM WITH TOMO AND CAD COMPARISON:  Previous exam(s). ACR Breast Density Category c: The breast tissue is heterogeneously dense, which may obscure small masses. FINDINGS: There are no findings suspicious for malignancy. Images were processed with CAD. IMPRESSION: No mammographic evidence of malignancy. A result letter of this screening mammogram will be mailed directly to the patient. RECOMMENDATION: Screening mammogram in one year. (Code:SM-B-01Y) BI-RADS CATEGORY  1: Negative. Electronically Signed   By: Lillia Mountain M.D.   On: 04/25/2019 16:34       Assessment & Plan:   Problem List Items Addressed This Visit    Osteopenia    Schedule f/u bone density with next mammogram.        Hypertension, essential    On lisinopril.  Blood pressure as outlined.  Follow pressures.  Follow metabolic panel.       Relevant Orders   Comprehensive metabolic panel   Lipid panel   TSH   Hyperglycemia    Follow met b and a1c.       Relevant Orders   Hemoglobin A1c   Anemia    Follow cbc. Colonoscopy 07/2018.  Recommended f/u in 5 years.        Relevant Orders   CBC with Differential/Platelet   IBC + Ferritin    Other Visit Diagnoses    Estrogen deficiency    -  Primary   Relevant Orders   DG Bone Density   Encounter for screening mammogram for malignant neoplasm of breast       Relevant Orders   MM 3D SCREEN BREAST BILATERAL       Einar Pheasant, MD

## 2020-02-01 ENCOUNTER — Encounter: Payer: Self-pay | Admitting: Internal Medicine

## 2020-02-01 NOTE — Assessment & Plan Note (Signed)
Follow met b and a1c.  

## 2020-02-01 NOTE — Assessment & Plan Note (Signed)
On lisinopril.  Blood pressure as outlined.  Follow pressures.  Follow metabolic panel.  

## 2020-02-01 NOTE — Assessment & Plan Note (Signed)
Schedule f/u bone density with next mammogram.

## 2020-02-01 NOTE — Assessment & Plan Note (Signed)
Follow cbc. Colonoscopy 07/2018.  Recommended f/u in 5 years.   

## 2020-04-26 ENCOUNTER — Other Ambulatory Visit: Payer: Self-pay

## 2020-04-26 ENCOUNTER — Ambulatory Visit
Admission: RE | Admit: 2020-04-26 | Discharge: 2020-04-26 | Disposition: A | Discharge status: Home / Self Care | Payer: Medicare Other | Source: Ambulatory Visit | Attending: Internal Medicine | Admitting: Internal Medicine

## 2020-04-26 DIAGNOSIS — E2839 Other primary ovarian failure: Secondary | ICD-10-CM | POA: Insufficient documentation

## 2020-04-26 DIAGNOSIS — Z1231 Encounter for screening mammogram for malignant neoplasm of breast: Secondary | ICD-10-CM

## 2020-05-07 ENCOUNTER — Other Ambulatory Visit (INDEPENDENT_AMBULATORY_CARE_PROVIDER_SITE_OTHER): Payer: Medicare Other

## 2020-05-07 ENCOUNTER — Other Ambulatory Visit: Payer: Self-pay

## 2020-05-07 DIAGNOSIS — I10 Essential (primary) hypertension: Secondary | ICD-10-CM

## 2020-05-07 DIAGNOSIS — R739 Hyperglycemia, unspecified: Secondary | ICD-10-CM | POA: Diagnosis not present

## 2020-05-07 DIAGNOSIS — D649 Anemia, unspecified: Secondary | ICD-10-CM

## 2020-05-07 LAB — COMPREHENSIVE METABOLIC PANEL
ALT: 15 U/L (ref 0–35)
AST: 20 U/L (ref 0–37)
Albumin: 4.2 g/dL (ref 3.5–5.2)
Alkaline Phosphatase: 61 U/L (ref 39–117)
BUN: 20 mg/dL (ref 6–23)
CO2: 30 mEq/L (ref 19–32)
Calcium: 9.5 mg/dL (ref 8.4–10.5)
Chloride: 103 mEq/L (ref 96–112)
Creatinine, Ser: 0.84 mg/dL (ref 0.40–1.20)
GFR: 72.79 mL/min (ref >60.00)
Glucose, Bld: 82 mg/dL (ref 70–99)
Potassium: 4.5 mEq/L (ref 3.5–5.1)
Sodium: 140 mEq/L (ref 135–145)
Total Bilirubin: 0.4 mg/dL (ref 0.2–1.2)
Total Protein: 6.7 g/dL (ref 6.0–8.3)

## 2020-05-07 LAB — CBC WITH DIFFERENTIAL/PLATELET
Basophils Absolute: 0 10*3/uL (ref 0.0–0.1)
Basophils Relative: 0.7 % (ref 0.0–3.0)
Eosinophils Absolute: 0.2 10*3/uL (ref 0.0–0.7)
Eosinophils Relative: 3.2 % (ref 0.0–5.0)
HCT: 42 % (ref 36.0–46.0)
Hemoglobin: 13.8 g/dL (ref 12.0–15.0)
Lymphocytes Relative: 32.9 % (ref 12.0–46.0)
Lymphs Abs: 1.9 10*3/uL (ref 0.7–4.0)
MCHC: 32.8 g/dL (ref 30.0–36.0)
MCV: 89.9 fl (ref 78.0–100.0)
Monocytes Absolute: 0.4 10*3/uL (ref 0.1–1.0)
Monocytes Relative: 7.4 % (ref 3.0–12.0)
Neutro Abs: 3.2 10*3/uL (ref 1.4–7.7)
Neutrophils Relative %: 55.8 % (ref 43.0–77.0)
Platelets: 232 10*3/uL (ref 150.0–400.0)
RBC: 4.67 Mil/uL (ref 3.87–5.11)
RDW: 13.2 % (ref 11.5–15.5)
WBC: 5.7 10*3/uL (ref 4.0–10.5)

## 2020-05-07 LAB — LIPID PANEL
Cholesterol: 205 mg/dL — ABNORMAL HIGH (ref 0–200)
HDL: 72.7 mg/dL (ref >39.00)
LDL Cholesterol: 119 mg/dL — ABNORMAL HIGH (ref 0–99)
NonHDL: 131.95
Total CHOL/HDL Ratio: 3
Triglycerides: 65 mg/dL (ref 0.0–149.0)
VLDL: 13 mg/dL (ref 0.0–40.0)

## 2020-05-07 LAB — IBC + FERRITIN
Ferritin: 66.7 ng/mL (ref 10.0–291.0)
Iron: 73 ug/dL (ref 42–145)
Saturation Ratios: 24.7 % (ref 20.0–50.0)
Transferrin: 211 mg/dL — ABNORMAL LOW (ref 212.0–360.0)

## 2020-05-07 LAB — TSH: TSH: 3.29 u[IU]/mL (ref 0.35–4.50)

## 2020-05-07 LAB — HEMOGLOBIN A1C: Hgb A1c MFr Bld: 5.7 % (ref 4.6–6.5)

## 2020-05-10 ENCOUNTER — Other Ambulatory Visit: Payer: Self-pay

## 2020-05-10 ENCOUNTER — Encounter: Payer: Self-pay | Admitting: Internal Medicine

## 2020-05-10 ENCOUNTER — Ambulatory Visit (INDEPENDENT_AMBULATORY_CARE_PROVIDER_SITE_OTHER): Payer: Medicare Other | Admitting: Internal Medicine

## 2020-05-10 VITALS — BP 138/82 | HR 70 | Temp 98.4°F | Ht 64.02 in | Wt 113.0 lb

## 2020-05-10 DIAGNOSIS — Z0000 Encounter for general adult medical examination without abnormal findings: Secondary | ICD-10-CM | POA: Diagnosis not present

## 2020-05-10 MED ORDER — ALENDRONATE SODIUM 70 MG PO TABS: 70.0000 mg | ORAL_TABLET | ORAL | 3 refills | Status: DC

## 2020-06-03 ENCOUNTER — Encounter: Payer: Self-pay | Admitting: Internal Medicine

## 2020-06-03 NOTE — Progress Notes (Signed)
The patient is here for Welcome to Medicare wellness examination and management of other chronic and acute problems.   The risk factors are reflected in the social history.  The roster of all physicians providing medical care to patient - previously saw Dr Tiffany Kocher - GI and Dr Mortimer Fries - pulmonary.    Activities of daily living:  The patient is 100% independent in all ADLs: dressing, toileting, feeding as well as independent mobility   HomActivities of Daily Living In your present state of health, do you have any difficulty performing the following activities: 05/10/2020  Hearing? N  Vision? N  Difficulty concentrating or making decisions? N  Walking or climbing stairs? N  Dressing or bathing? N  Doing errands, shopping? N  Preparing Food and eating ? N  Using the Toilet? N  In the past six months, have you accidently leaked urine? N  Do you have problems with loss of bowel control? N  Managing your Medications? N  Managing your Finances? N  Housekeeping or managing your Housekeeping? N   Safety : She wears seatbelts.  There is no violence in the home.   There is no risks for hepatitis, STDs or HIV. There is no   history of blood transfusion. No recent travel history to infectious disease endemic areas of the world.  Vision check:  Left eye 20/40, right 20/25 and bilateral eyes:  20/20.   Has appt with Dr Thomasene Ripple (ophthalmology)  next week.  No hearing difficulty with regard to whispered voices.  Able to hear voices and answers questions appropriately.  Has noticed some decrease with crowds/noise.  She does not  have excessive sun exposure. Need for sun protection: hats, long sleeves and use of sunscreen if there is significant sun exposure.   Diet: the importance of a healthy diet.  She is eating a health diet.   The benefits of regular aerobic exercise have been discussed. She exercises regularly. Is walking.   Depression screen: there are no signs or vegative symptoms of depression-  irritability, change in appetite, anhedonia, sadness/tearfullness.  Cognitive assessment: the patient manages all their financial and personal affairs and is actively engaged. They could relate day,date,year and events.  Mini-mental status exam:  30/30.   The following portions of the patient's history were reviewed and updated as appropriate: allergies, current medications, past family history, past medical history,  past surgical history, past social history  and problem list.  Body mass index were assessed and reviewed.   EKG:  SR with no acute ischemic changes.   Blood Pressure:  124/64  During the course of the visit the patient was educated and counseled about appropriate screening and preventive services including : fall prevention , diabetes screening, nutrition counseling, colorectal cancer screening, and recommended immunizations.   Immunizations:   Had flu vaccine 02/04/20 and covid booster 03/08/20.  Prevnar 10/16/19. Td 10/16/19.    Fall assessment:  No recent falls.   Mammogram:  07/03/19  Colonoscopy:  02/2019 - recommended f/u colonoscopy in 3 years.    Bone Density:  04/2020.   Discussed:  She has a will.  Husband named POA if issues.  Has long term health insurance.    Reviewed labs.  Blood pressure wnl.    End of life planning; Advance aging; Advanced directives discussed.  Discussed:  She has a will.  Husband named POA if issues.  Has long term health insurance.    I have personally reviewed and noted the following in the patient's chart:  Medical and social history  Use of alcohol, tobacco or illicit drugs   Current medications and supplements  Functional ability and status  Nutritional status  Physical activity  Advanced directives  List of other physicians  Hospitalizations, surgeries, and ER visits in previous 12 months  Vitals  Screenings to include cognitive, depression, and falls  Referrals and appointments  In addition, I have  reviewed and discussed with patient certain preventive protocols, quality metrics, and best practice recommendations. A written personalized care plan for preventive services as well as general preventive health recommendations were provided to patient.

## 2020-06-08 ENCOUNTER — Other Ambulatory Visit: Payer: Self-pay | Admitting: Internal Medicine

## 2020-09-11 IMAGING — CR DG CHEST 2V
1 series · 2 of 2 positions shown · non-contrast
Comparison: None.

CLINICAL DATA: Chest pain

EXAM:
CHEST - 2 VIEW

[Series 1: dg chest 2 view · 0.14mm/px · 2 of 2 slices shown]
[im 1/2]
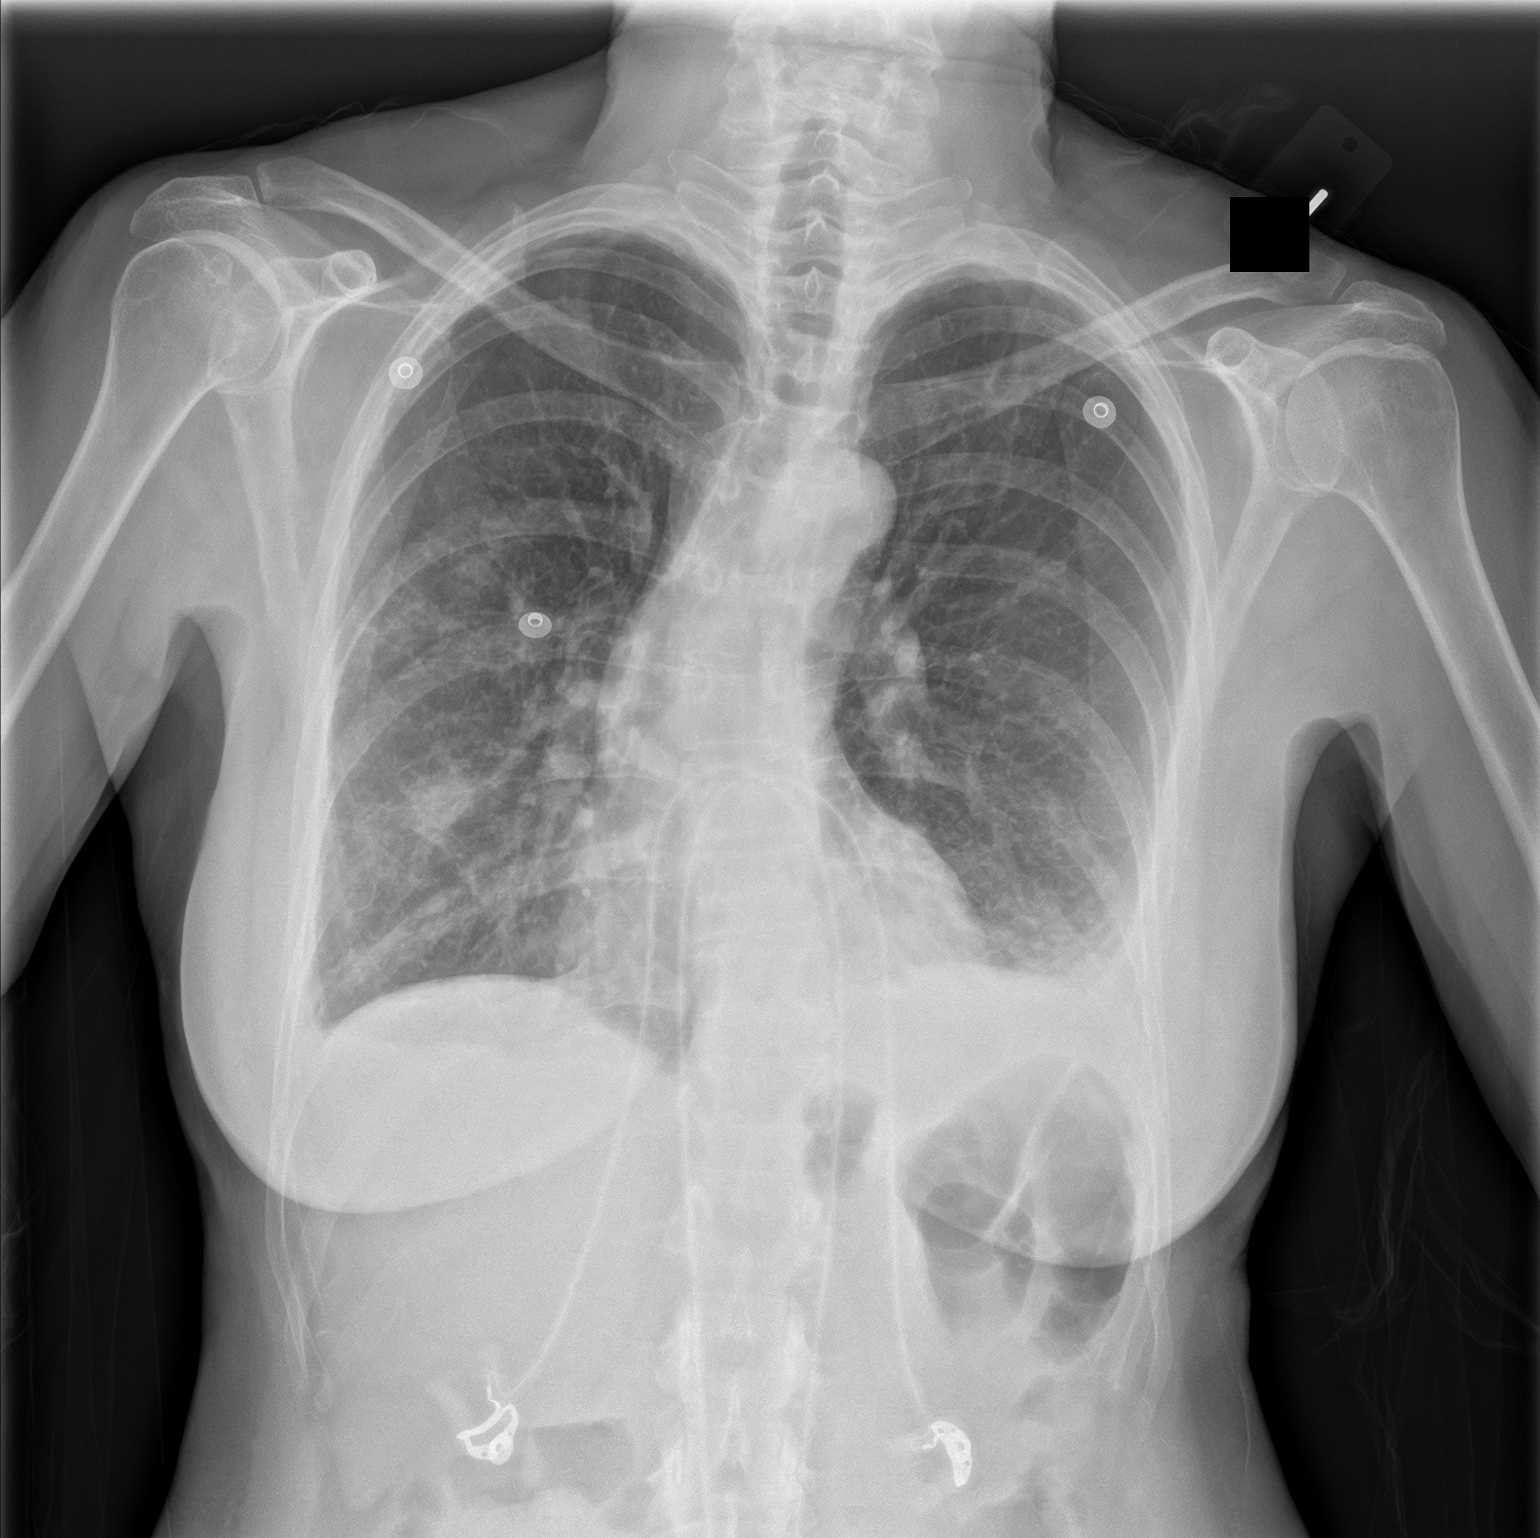
[im 2/2]
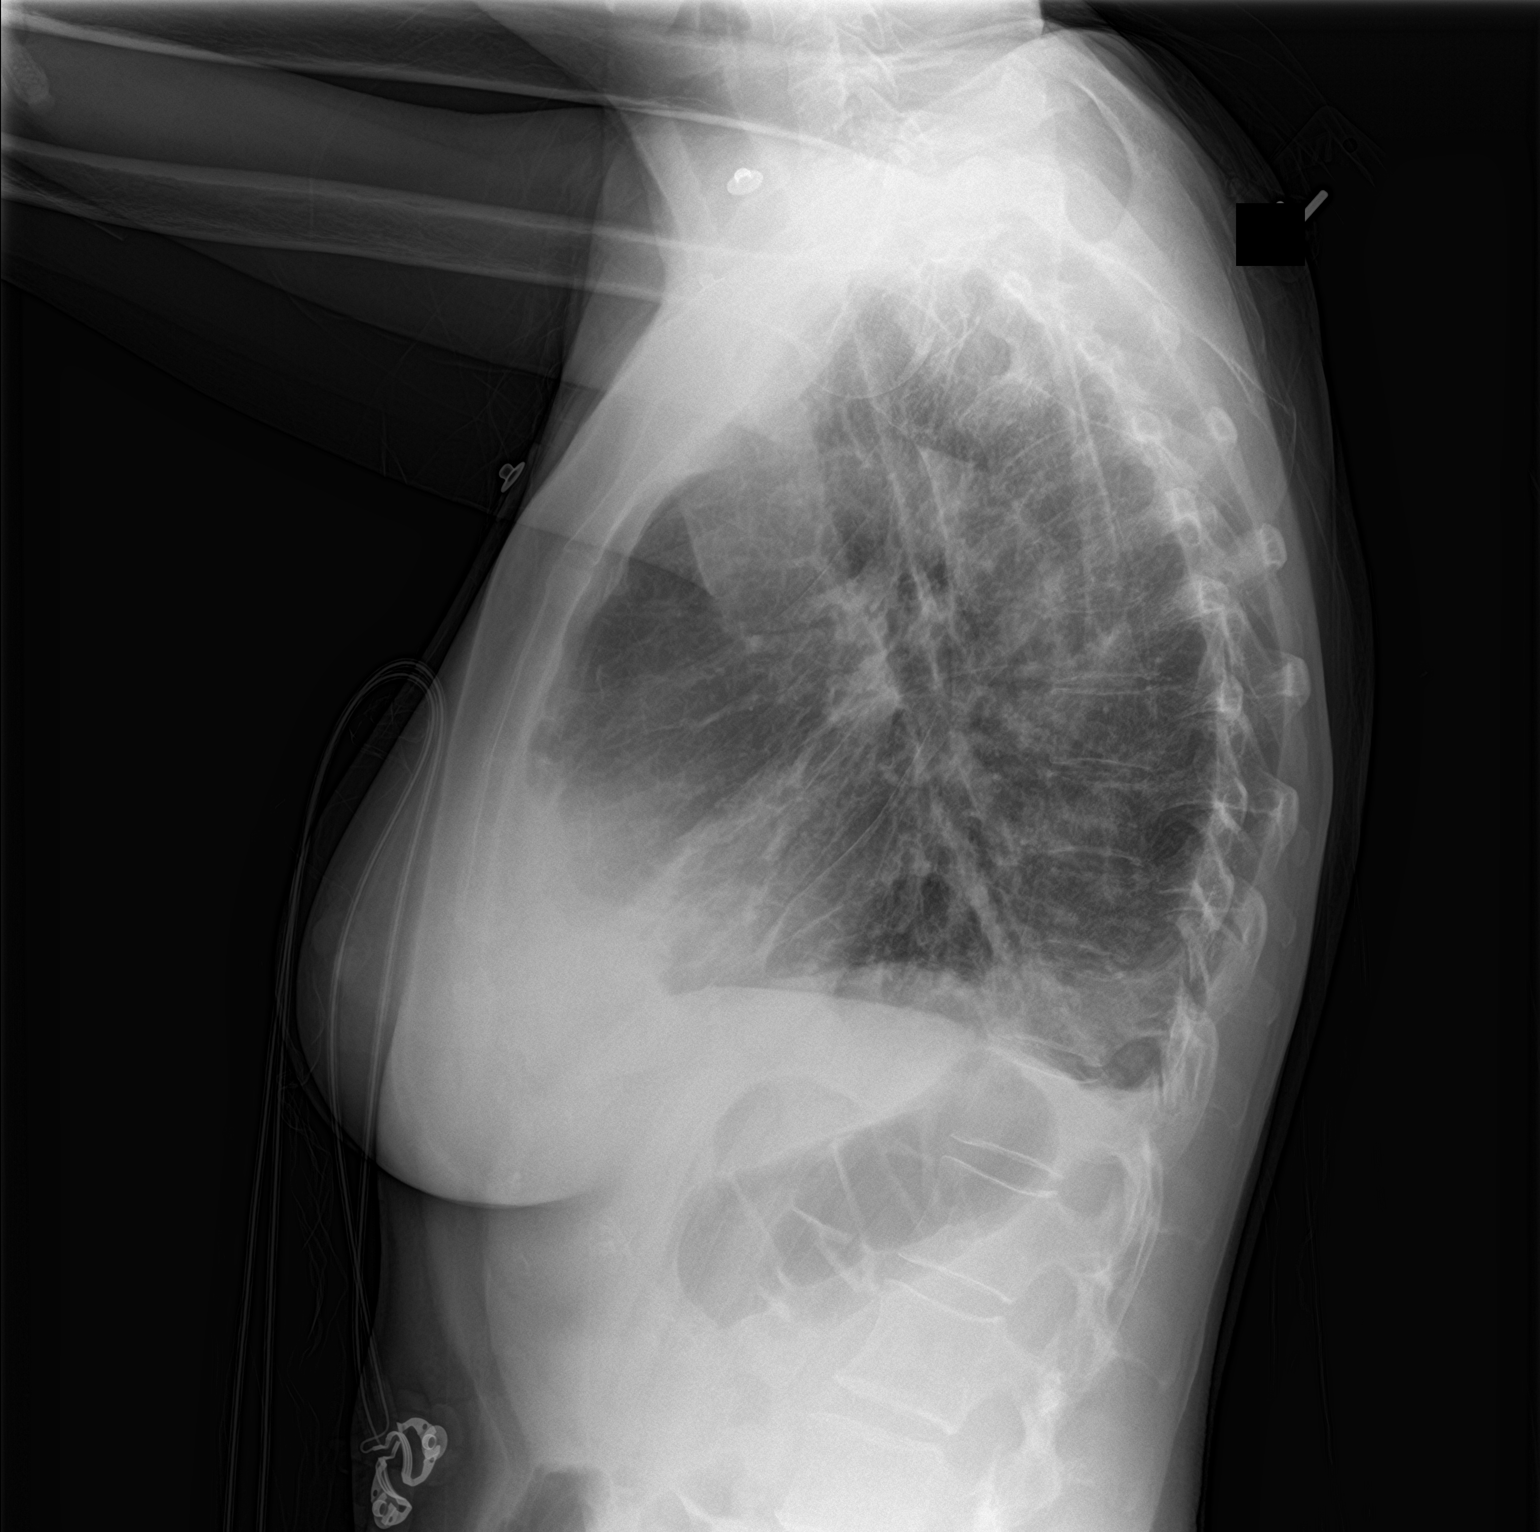

[2 of 2 positions shown; findings below may reference images not displayed]

FINDINGS: Small bilateral pleural effusions, left greater than right. Airspace
disease at the left base. Possible 1 cm nodule in the right lower
lung. Normal heart size. No pneumothorax.
IMPRESSION: 1. Small left greater than right pleural effusions with left basilar
atelectasis or pneumonia
2. Possible 1 cm right lower lung nodule. Suggest chest CT to
further evaluate.

## 2020-09-15 IMAGING — CR DG CHEST 2V
1 series · 2 of 2 positions shown · non-contrast
Comparison: 03/28/2018

CLINICAL DATA: Fever, LEFT side pain, pleural effusion, history
hypertension

EXAM:
CHEST - 2 VIEW

[Series 1: dg chest 2 view · 0.14mm/px · 2 of 2 slices shown]
[im 1/2]
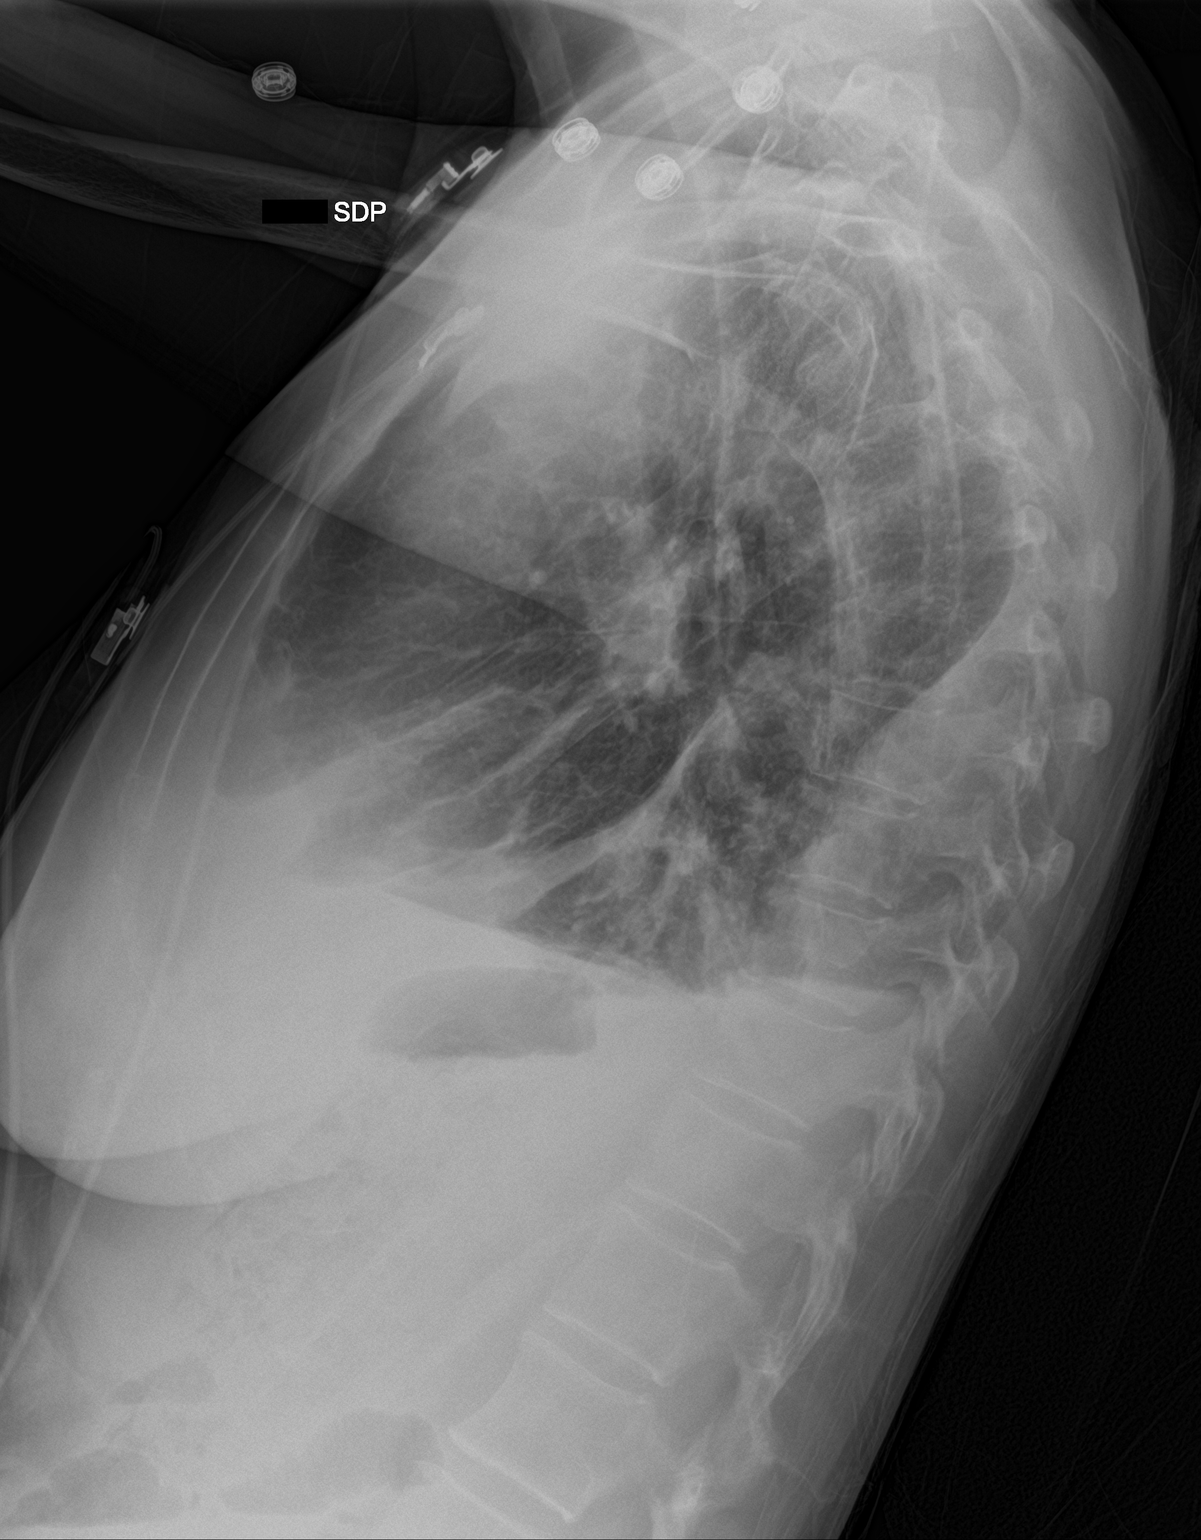
[im 2/2]
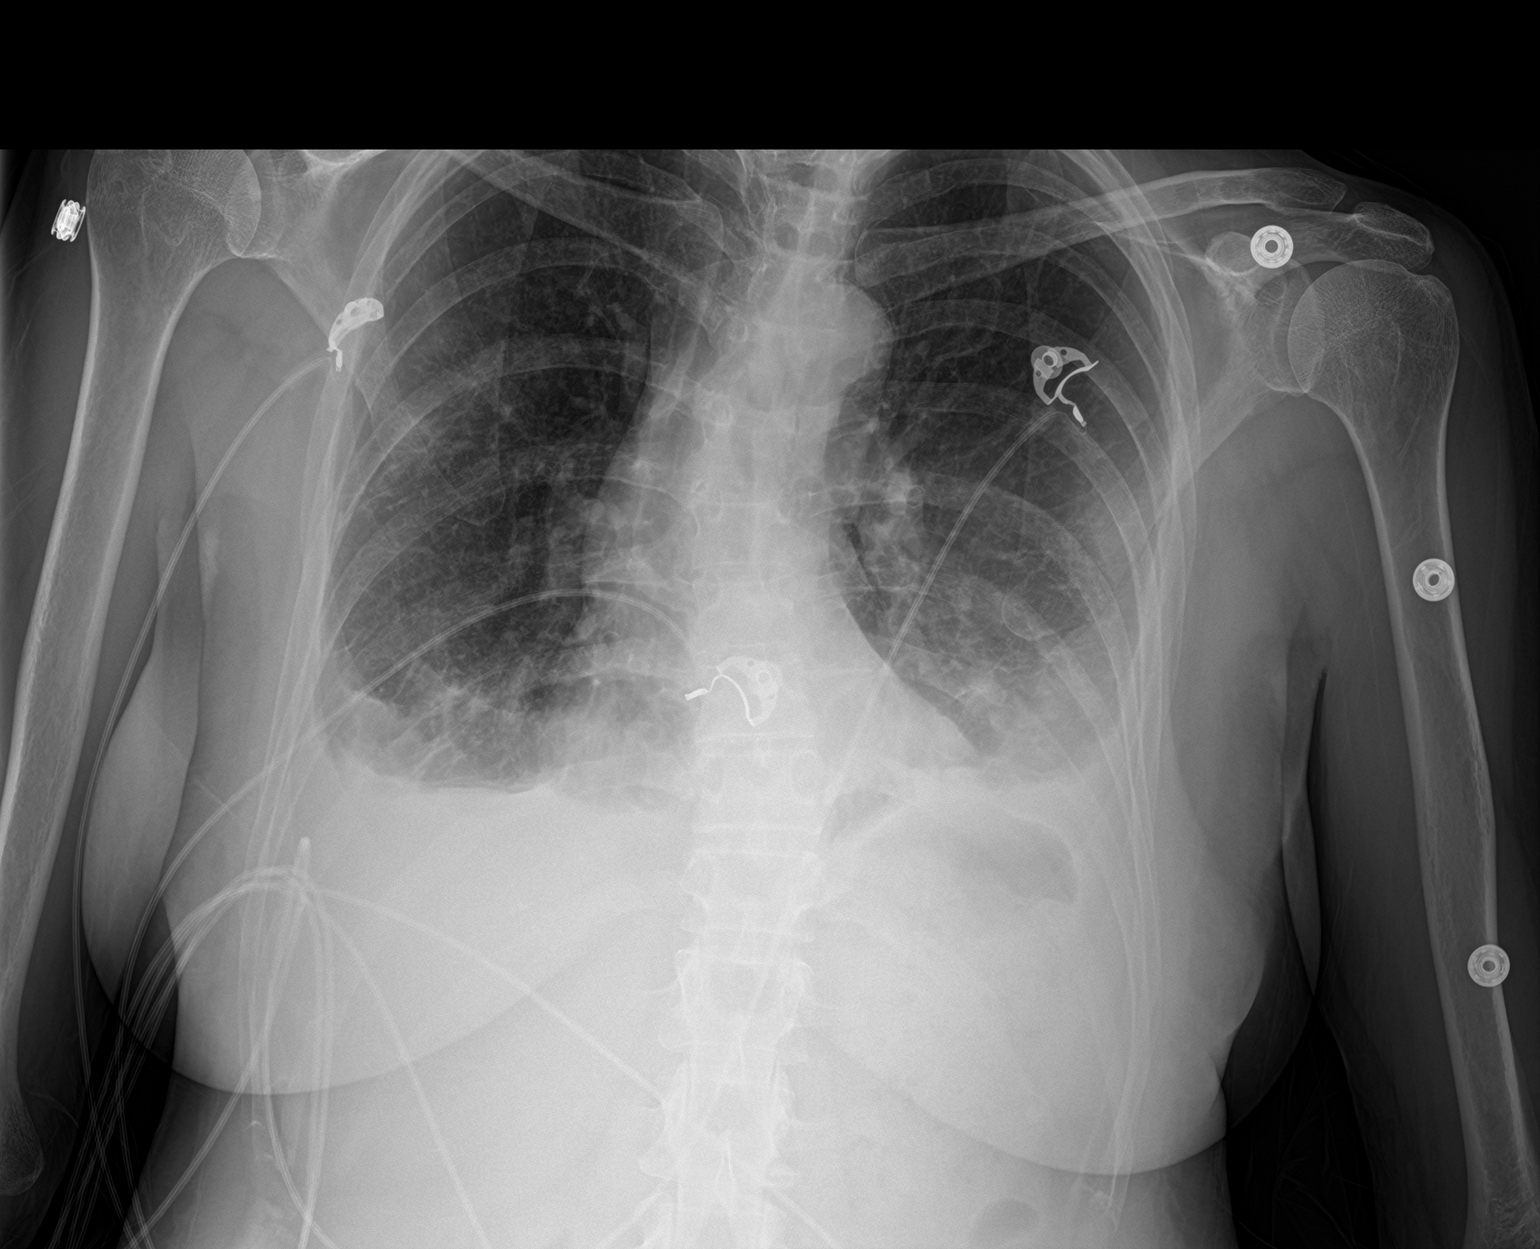

[2 of 2 positions shown; findings below may reference images not displayed]

FINDINGS: Normal heart size, mediastinal contours, and pulmonary vascularity.

Bibasilar effusions and atelectasis increased since previous exam.

Underlying bronchitic and emphysematous changes.

Infiltrate at lower RIGHT lung on previous exam slightly improved.

No pneumothorax.

Bones demineralized.
IMPRESSION: COPD changes with increased bibasilar effusions and atelectasis.

Slight improvement of RIGHT lung infiltrates since prior study.

## 2020-09-16 IMAGING — DX DG CHEST 1V PORT
1 series · 1 of 1 positions shown · non-contrast
Comparison: 03/28/2018

CLINICAL DATA: Post left thoracentesis

EXAM:
PORTABLE CHEST 1 VIEW

[chest ap]
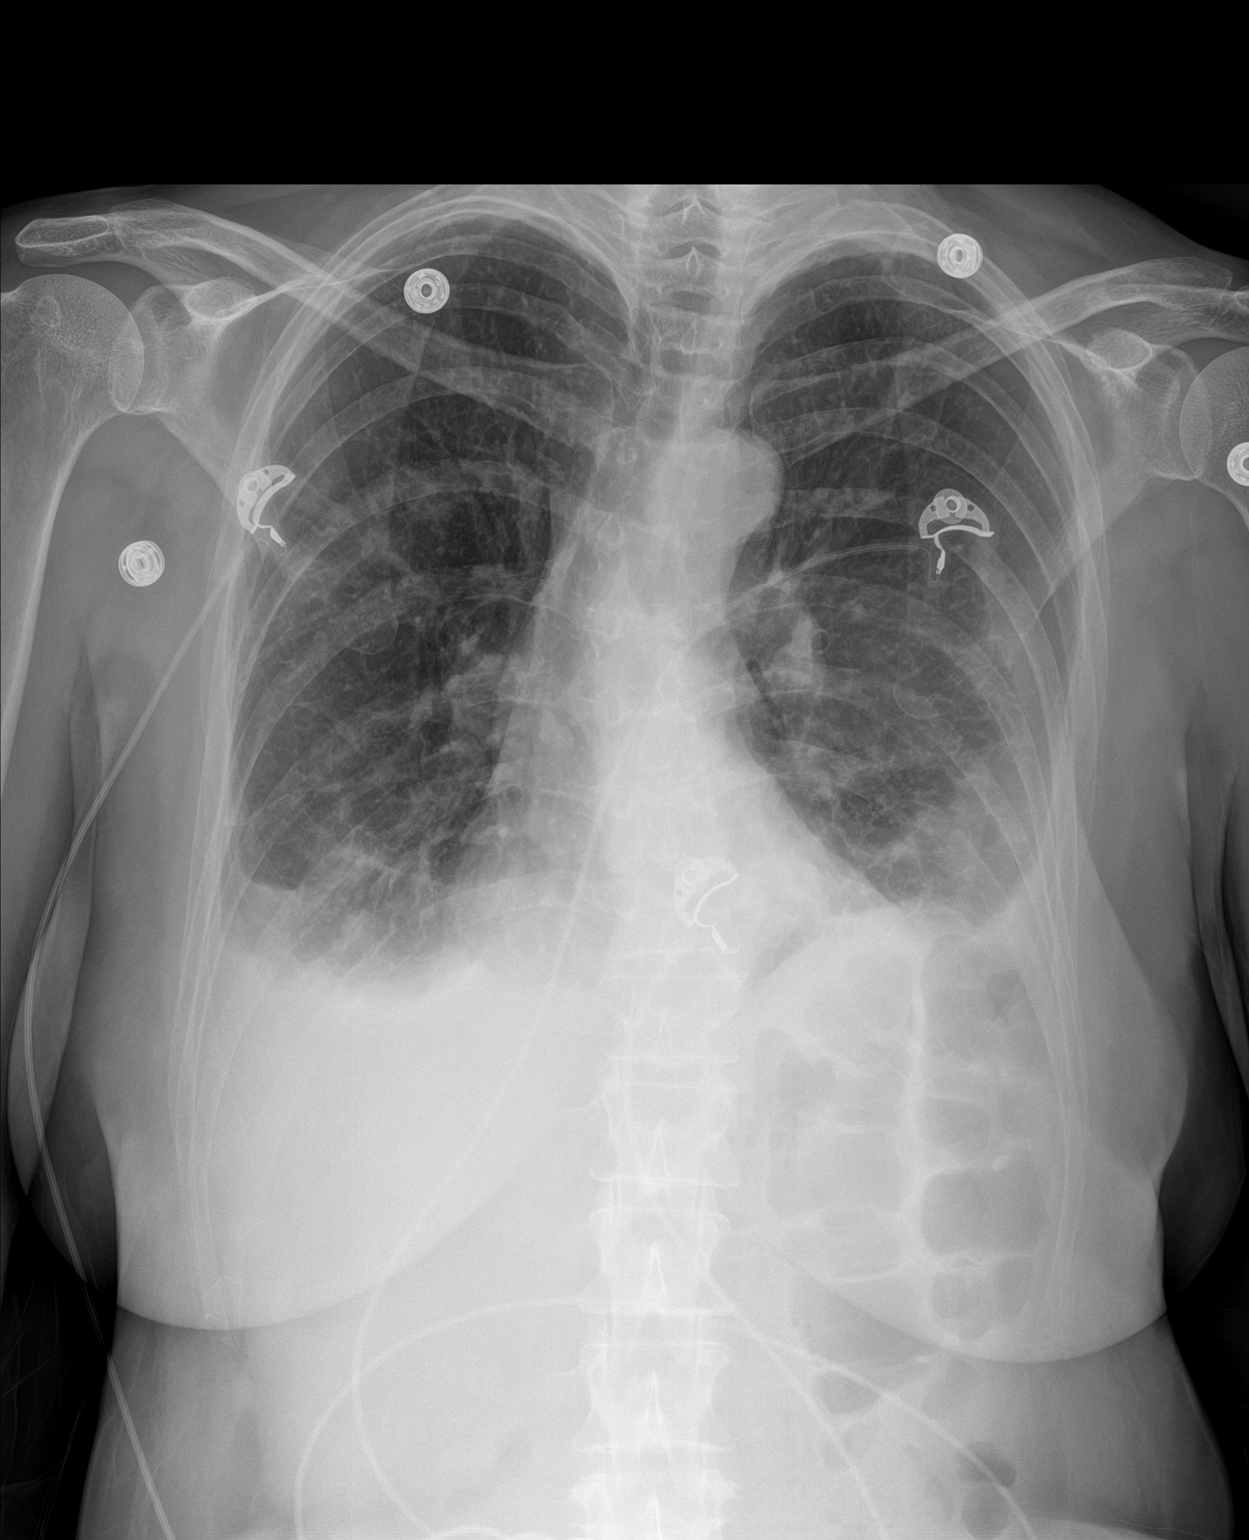

[1 of 1 positions shown; findings below may reference images not displayed]

FINDINGS: Small bilateral pleural effusions are stable. Bibasilar pulmonary
opacities are stable. Upper normal heart size. No pneumothorax.
IMPRESSION: No pneumothorax post left thoracentesis.

## 2020-10-21 ENCOUNTER — Encounter: Payer: Self-pay | Admitting: Internal Medicine

## 2020-11-08 ENCOUNTER — Ambulatory Visit: Payer: PRIVATE HEALTH INSURANCE | Admitting: Internal Medicine

## 2020-11-30 ENCOUNTER — Other Ambulatory Visit: Payer: Self-pay | Admitting: Internal Medicine

## 2020-12-24 ENCOUNTER — Ambulatory Visit (INDEPENDENT_AMBULATORY_CARE_PROVIDER_SITE_OTHER): Payer: Medicare Other | Admitting: Internal Medicine

## 2020-12-24 ENCOUNTER — Encounter: Payer: Self-pay | Admitting: Internal Medicine

## 2020-12-24 ENCOUNTER — Other Ambulatory Visit: Payer: Self-pay

## 2020-12-24 VITALS — BP 122/80 | HR 63 | Temp 97.7°F | Ht 64.02 in | Wt 111.2 lb

## 2020-12-24 DIAGNOSIS — Z86010 Personal history of colonic polyps: Secondary | ICD-10-CM

## 2020-12-24 DIAGNOSIS — Z13220 Encounter for screening for lipoid disorders: Secondary | ICD-10-CM

## 2020-12-24 DIAGNOSIS — D649 Anemia, unspecified: Secondary | ICD-10-CM

## 2020-12-24 DIAGNOSIS — R739 Hyperglycemia, unspecified: Secondary | ICD-10-CM | POA: Diagnosis not present

## 2020-12-24 DIAGNOSIS — I10 Essential (primary) hypertension: Secondary | ICD-10-CM

## 2020-12-24 LAB — CBC WITH DIFFERENTIAL/PLATELET
Basophils Absolute: 0 10*3/uL (ref 0.0–0.1)
Basophils Relative: 0.6 % (ref 0.0–3.0)
Eosinophils Absolute: 0.2 10*3/uL (ref 0.0–0.7)
Eosinophils Relative: 3 % (ref 0.0–5.0)
HCT: 40.4 % (ref 36.0–46.0)
Hemoglobin: 13.3 g/dL (ref 12.0–15.0)
Lymphocytes Relative: 30.9 % (ref 12.0–46.0)
Lymphs Abs: 1.6 10*3/uL (ref 0.7–4.0)
MCHC: 33 g/dL (ref 30.0–36.0)
MCV: 90.1 fl (ref 78.0–100.0)
Monocytes Absolute: 0.3 10*3/uL (ref 0.1–1.0)
Monocytes Relative: 6.6 % (ref 3.0–12.0)
Neutro Abs: 3.1 10*3/uL (ref 1.4–7.7)
Neutrophils Relative %: 58.9 % (ref 43.0–77.0)
Platelets: 222 10*3/uL (ref 150.0–400.0)
RBC: 4.49 Mil/uL (ref 3.87–5.11)
RDW: 13.3 % (ref 11.5–15.5)
WBC: 5.3 10*3/uL (ref 4.0–10.5)

## 2020-12-24 LAB — LIPID PANEL
Cholesterol: 215 mg/dL — ABNORMAL HIGH (ref 0–200)
HDL: 77.3 mg/dL (ref >39.00)
LDL Cholesterol: 128 mg/dL — ABNORMAL HIGH (ref 0–99)
NonHDL: 137.56
Total CHOL/HDL Ratio: 3
Triglycerides: 47 mg/dL (ref 0.0–149.0)
VLDL: 9.4 mg/dL (ref 0.0–40.0)

## 2020-12-24 LAB — BASIC METABOLIC PANEL
BUN: 24 mg/dL — ABNORMAL HIGH (ref 6–23)
CO2: 29 mEq/L (ref 19–32)
Calcium: 9.3 mg/dL (ref 8.4–10.5)
Chloride: 104 mEq/L (ref 96–112)
Creatinine, Ser: 0.88 mg/dL (ref 0.40–1.20)
GFR: 68.53 mL/min (ref >60.00)
Glucose, Bld: 78 mg/dL (ref 70–99)
Potassium: 4.2 mEq/L (ref 3.5–5.1)
Sodium: 141 mEq/L (ref 135–145)

## 2020-12-24 LAB — HEPATIC FUNCTION PANEL
ALT: 16 U/L (ref 0–35)
AST: 19 U/L (ref 0–37)
Albumin: 4.1 g/dL (ref 3.5–5.2)
Alkaline Phosphatase: 50 U/L (ref 39–117)
Bilirubin, Direct: 0 mg/dL (ref 0.0–0.3)
Total Bilirubin: 0.4 mg/dL (ref 0.2–1.2)
Total Protein: 6.7 g/dL (ref 6.0–8.3)

## 2020-12-24 LAB — HEMOGLOBIN A1C: Hgb A1c MFr Bld: 6 % (ref 4.6–6.5)

## 2020-12-24 LAB — FERRITIN: Ferritin: 76.8 ng/mL (ref 10.0–291.0)

## 2020-12-24 NOTE — Progress Notes (Signed)
[name], female   DOB: 1955-09-14, 65 y.o.   MRN: [id number]   Subjective:    [name], female    DOB: [birth year], 65 y.o.   MRN: [id number]  HPI This visit occurred during the SARS-CoV-2 public health emergency.  Safety protocols were in place, including screening questions prior to the visit, additional usage of staff PPE, and extensive cleaning of exam room while observing appropriate contact time as indicated for disinfecting solutions.   Patient here for a scheduled follow up.  Here to follow up regarding her blood pressure.  She is doing well.  Exercises.  Walking.  Yoga - on zoom.  No chest pain or sob.  No increased cough or congestion.  No abdominal pain.  Bowels moving.  No urine change.   Past Medical History:  Diagnosis Date   Allergy    Colon polyps    Hearing loss, sensorineural 1993   HSV-1 (herpes simplex virus 1) infection    Hypertension    Osteopenia    Postmenopausal    Past Surgical History:  Procedure Laterality Date   BREAST BIOPSY  1977   Family History  Problem Relation Age of Onset   Heart disease Father        CHF   Diabetes Father    Breast cancer Neg Hx    Social History   Socioeconomic History   Marital status: Married    Spouse name: Not on file   Number of children: Not on file   Years of education: Not on file   Highest education level: Not on file  Occupational History   Not on file  Tobacco Use   Smoking status: Former    Packs/day: 0.50    Types: Cigarettes    Start date: 16    Quit date: 57    Years since quitting: 41.5   Smokeless tobacco: Never  Substance and Sexual Activity   Alcohol use: Yes    Alcohol/week: 0.0 standard drinks   Drug use: No   Sexual activity: Not on file  Other Topics Concern   Not on file  Social History Narrative   Not on file   Social Determinants of Health   Financial Resource Strain: Not on file  Food Insecurity: Not on file  Transportation  Needs: Not on file  Physical Activity: Not on file  Stress: Not on file  Social Connections: Not on file    Review of Systems  Constitutional:  Negative for appetite change and unexpected weight change.  HENT:  Negative for congestion and sinus pressure.   Respiratory:  Negative for cough, chest tightness and shortness of breath.   Cardiovascular:  Negative for chest pain and palpitations.  Gastrointestinal:  Negative for abdominal pain, diarrhea, nausea and vomiting.  Genitourinary:  Negative for difficulty urinating and dysuria.  Musculoskeletal:  Negative for joint swelling and myalgias.  Neurological:  Negative for dizziness, light-headedness and headaches.  Psychiatric/Behavioral:  Negative for agitation and dysphoric mood.       Objective:    Physical Exam Vitals reviewed.  Constitutional:      General: She is not in acute distress.    Appearance: Normal appearance.  HENT:     Head: Normocephalic and atraumatic.     Right Ear: External ear normal.     Left Ear: External ear normal.  Eyes:     General: No scleral icterus.       Right eye: No discharge.  Left eye: No discharge.     Conjunctiva/sclera: Conjunctivae normal.  Neck:     Thyroid: No thyromegaly.  Cardiovascular:     Rate and Rhythm: Normal rate and regular rhythm.  Pulmonary:     Effort: No respiratory distress.     Breath sounds: Normal breath sounds. No wheezing.  Abdominal:     General: Bowel sounds are normal.     Palpations: Abdomen is soft.     Tenderness: There is no abdominal tenderness.  Musculoskeletal:        General: No swelling or tenderness.     Cervical back: Neck supple. No tenderness.  Lymphadenopathy:     Cervical: No cervical adenopathy.  Skin:    Findings: No erythema or rash.  Neurological:     Mental Status: She is alert.  Psychiatric:        Mood and Affect: Mood normal.        Behavior: Behavior normal.    BP 122/80   Pulse 63   Temp 97.7 F (36.5 C)   Ht 5'  4.02" (1.626 m)   Wt 111 lb 3.2 oz (50.4 kg)   LMP 07/26/2003   SpO2 98%   BMI 19.08 kg/m  Wt Readings from Last 3 Encounters:  12/24/20 111 lb 3.2 oz (50.4 kg)  05/10/20 113 lb (51.3 kg)  01/23/20 113 lb (51.3 kg)    Outpatient Encounter Medications as of 12/24/2020  Medication Sig   alendronate (FOSAMAX) 70 MG tablet Take 1 tablet (70 mg total) by mouth every 7 (seven) days. Take with a full glass of water on an empty stomach.   Calcium Citrate (CITRACAL PO) Take 1 tablet by mouth daily.   ibuprofen (ADVIL,MOTRIN) 400 MG tablet Take 1 tablet (400 mg total) by mouth 2 (two) times daily.   lisinopril (ZESTRIL) 10 MG tablet TAKE 1 TABLET BY MOUTH EVERY DAY   Omega-3 Fatty Acids (FISH OIL) 600 MG CAPS Take 1 tablet by mouth daily.   triamcinolone (NASACORT) 55 MCG/ACT nasal inhaler Place 2 sprays into the nose daily.   No facility-administered encounter medications on file as of 12/24/2020.     Lab Results  Component Value Date   WBC 5.3 12/24/2020   HGB 13.3 12/24/2020   HCT 40.4 12/24/2020   PLT 222.0 12/24/2020   GLUCOSE 78 12/24/2020   CHOL 215 (H) 12/24/2020   TRIG 47.0 12/24/2020   HDL 77.30 12/24/2020   LDLDIRECT 113.2 08/07/2012   LDLCALC 128 (H) 12/24/2020   ALT 16 12/24/2020   AST 19 12/24/2020   NA 141 12/24/2020   K 4.2 12/24/2020   CL 104 12/24/2020   CREATININE 0.88 12/24/2020   BUN 24 (H) 12/24/2020   CO2 29 12/24/2020   TSH 3.29 05/07/2020   HGBA1C 6.0 12/24/2020    DG Bone Density  Result Date: 04/26/2020 EXAM: DUAL X-RAY ABSORPTIOMETRY (DXA) FOR BONE MINERAL DENSITY IMPRESSION: Your patient Aimee Gomez completed a BMD test on 04/26/2020 using the [place] (software version: 14.10) manufactured by [employer]. The following summarizes the results of our evaluation. Technologist:VLM PATIENT BIOGRAPHICAL: Name: Aimee, Gomez Patient ID: [id number] Birth Date: [birth year] Height: 63.0 in. Gender: Female Exam Date: 04/26/2020  Weight: 111.8 lbs. Indications: Postmenopausal, Caucasian Fractures: Treatments: Vitamin D, Calcium DENSITOMETRY RESULTS: Site      Region     Measured Date Measured Age WHO Classification Young Adult T-score BMD         %Change vs. Previous Significant Change (*)  DualFemur Neck Right 04/26/2020 65.6 Osteopenia -2.2 0.732 g/cm2 -0.7% - DualFemur Neck Right 04/23/2018 63.6 Osteopenia -2.2 0.737 g/cm2 - - DualFemur Total Mean 04/26/2020 65.6 Osteopenia -1.8 0.777 g/cm2 -0.6% - DualFemur Total Mean 04/23/2018 63.6 Osteopenia -1.8 0.782 g/cm2 - - AP Spine L1-L3 04/26/2020 65.6 Osteopenia -1.1 1.042 g/cm2 -3.6% Yes AP Spine L1-L3 04/23/2018 63.6 Normal -0.8 1.081 g/cm2 - - ASSESSMENT: The BMD measured at Femur Neck Right is 0.732 g/cm2 with a T-score of -2.2. This patient is considered osteopenic according to [facility]) criteria. L- 4 was excluded due to degenerative changes. Compared with prior study, there has beensignificant decrease in the spine. Compared with prior study, there has been no significant change in the total hip. The scan quality is good. World Pharmacologist [facility]) criteria for post-menopausal, Caucasian Women: Normal:                   T-score at or above -1 SD Osteopenia/low bone mass: T-score between -1 and -2.5 SD Osteoporosis:             T-score at or below -2.5 SD RECOMMENDATIONS: 1. All patients should optimize calcium and vitamin D intake. 2. Consider FDA-approved medical therapies in postmenopausal women and men aged 9 years and older, based on the following: a. A hip or vertebral(clinical or morphometric) fracture b. T-score < -2.5 at the femoral neck or spine after appropriate evaluation to exclude secondary causes c. Low bone mass (T-score between -1.0 and -2.5 at the femoral neck or spine) and a 10-year probability of a hip fracture > 3% or a 10-year probability of a major osteoporosis-related fracture > 20% based on the [place]-adapted WHO algorithm 3. Clinician  judgment and/or patient preferences may indicate treatment for people with 10-year fracture probabilities above or below these levels FOLLOW-UP: People with diagnosed cases of osteoporosis or at high risk for fracture should have regular bone mineral density tests. For patients eligible for Medicare, routine testing is allowed once every 2 years. The testing frequency can be increased to one year for patients who have rapidly progressing disease, those who are receiving or discontinuing medical therapy to restore bone mass, or have additional risk factors. I have reviewed this report, and agree with the above findings. [facility] Dear Einar Pheasant, Your patient JENNIFERLYNN SAAD completed a FRAX assessment on 04/26/2020 using the [place] (analysis version: 14.10) manufactured by [employer]. The following summarizes the results of our evaluation. PATIENT BIOGRAPHICAL: Name: Sydell, Prowell Patient ID: [id number] Birth Date: [birth year] Height:    63.0 in. Gender:     Female    [age]       Weight:    111.8 lbs. Ethnicity:  White                            Exam Date: 04/26/2020 FRAX* RESULTS:  (version: 3.5) 10-year Probability of Fracture1 Major Osteoporotic Fracture2 Hip Fracture 10.3% 2.0% Population: [place] (Caucasian) Risk Factors: None Based on Femur (Right) Neck BMD 1 -The 10-year probability of fracture may be lower than reported if the patient has received treatment. 2 -Major Osteoporotic Fracture: Clinical Spine, Forearm, Hip or Shoulder *FRAX is a Materials engineer of the [employer] for Metabolic Bone Disease, a [place] (WHO) [facility]. ASSESSMENT: The probability of a major osteoporotic fracture is 10.3% within the next ten  years. The probability of a hip fracture is 2.0% within the next ten years. . Electronically Signed   By: Lowella Grip III M.D.   On: 04/26/2020 11:30   MM 3D SCREEN BREAST  BILATERAL  Result Date: 04/29/2020 CLINICAL DATA:  Screening. EXAM: DIGITAL SCREENING BILATERAL MAMMOGRAM WITH TOMO AND CAD COMPARISON:  Previous exam(s). ACR Breast Density Category c: The breast tissue is heterogeneously dense, which may obscure small masses. FINDINGS: There are no findings suspicious for malignancy. Images were processed with CAD. IMPRESSION: No mammographic evidence of malignancy. A result letter of this screening mammogram will be mailed directly to the patient. RECOMMENDATION: Screening mammogram in one year. (Code:SM-B-01Y) BI-RADS CATEGORY  1: Negative. Electronically Signed   By: Lajean Manes M.D.   On: 04/29/2020 09:44       Assessment & Plan:   Problem List Items Addressed This Visit     Anemia    Follow cbc. Colonoscopy 07/2018.  Recommended f/u in 5 years.         Relevant Orders   CBC with Differential/Platelet (Completed)   Ferritin (Completed)   History of colonic polyps    Colonoscopy 07/2018.  Reports no polyps.  Recommended f/u in 5 years.         Hyperglycemia - Primary    Follow met b and a1c.        Relevant Orders   Hemoglobin A1c (Completed)   Hypertension, essential    On lisinopril.  Blood pressure as outlined.  Follow pressures.  Follow metabolic panel.        Relevant Orders   Basic metabolic panel (Completed)   Hepatic function panel (Completed)   Other Visit Diagnoses     Screening cholesterol level       Relevant Orders   Lipid panel (Completed)        Einar Pheasant, MD

## 2020-12-24 NOTE — Patient Instructions (Signed)
Since you have had prevnar, you would need pneumovax (23)

## 2020-12-26 ENCOUNTER — Encounter: Payer: Self-pay | Admitting: Internal Medicine

## 2020-12-26 NOTE — Assessment & Plan Note (Signed)
Follow met b and a1c.  

## 2020-12-26 NOTE — Assessment & Plan Note (Signed)
Follow cbc. Colonoscopy 07/2018.  Recommended f/u in 5 years.   

## 2020-12-26 NOTE — Assessment & Plan Note (Signed)
On lisinopril.  Blood pressure as outlined.  Follow pressures.  Follow metabolic panel.  

## 2020-12-26 NOTE — Assessment & Plan Note (Signed)
Colonoscopy 07/2018.  Reports no polyps.  Recommended f/u in 5 years.   

## 2021-03-21 ENCOUNTER — Other Ambulatory Visit: Payer: Self-pay | Admitting: Internal Medicine

## 2021-03-21 DIAGNOSIS — Z1231 Encounter for screening mammogram for malignant neoplasm of breast: Secondary | ICD-10-CM

## 2021-04-01 ENCOUNTER — Other Ambulatory Visit: Payer: Self-pay | Admitting: Internal Medicine

## 2021-05-04 ENCOUNTER — Other Ambulatory Visit: Payer: Self-pay

## 2021-05-04 ENCOUNTER — Ambulatory Visit
Admission: RE | Admit: 2021-05-04 | Discharge: 2021-05-04 | Disposition: A | Discharge status: Home / Self Care | Payer: Medicare Other | Source: Ambulatory Visit | Attending: Internal Medicine | Admitting: Internal Medicine

## 2021-05-04 DIAGNOSIS — Z1231 Encounter for screening mammogram for malignant neoplasm of breast: Secondary | ICD-10-CM | POA: Insufficient documentation

## 2021-06-01 ENCOUNTER — Other Ambulatory Visit: Payer: Self-pay | Admitting: Internal Medicine

## 2021-07-05 ENCOUNTER — Ambulatory Visit (INDEPENDENT_AMBULATORY_CARE_PROVIDER_SITE_OTHER): Payer: Medicare Other | Admitting: Internal Medicine

## 2021-07-05 ENCOUNTER — Other Ambulatory Visit: Payer: Self-pay

## 2021-07-05 ENCOUNTER — Encounter: Payer: Self-pay | Admitting: Internal Medicine

## 2021-07-05 VITALS — BP 120/68 | HR 64 | Temp 98.0°F | Resp 16 | Ht 64.0 in | Wt 112.2 lb

## 2021-07-05 DIAGNOSIS — I10 Essential (primary) hypertension: Secondary | ICD-10-CM | POA: Diagnosis not present

## 2021-07-05 DIAGNOSIS — D649 Anemia, unspecified: Secondary | ICD-10-CM

## 2021-07-05 DIAGNOSIS — R739 Hyperglycemia, unspecified: Secondary | ICD-10-CM

## 2021-07-05 DIAGNOSIS — Z13220 Encounter for screening for lipoid disorders: Secondary | ICD-10-CM

## 2021-07-05 DIAGNOSIS — M8580 Other specified disorders of bone density and structure, unspecified site: Secondary | ICD-10-CM | POA: Diagnosis not present

## 2021-07-05 DIAGNOSIS — Z86010 Personal history of colonic polyps: Secondary | ICD-10-CM

## 2021-07-05 DIAGNOSIS — Z0000 Encounter for general adult medical examination without abnormal findings: Secondary | ICD-10-CM

## 2021-07-05 DIAGNOSIS — G479 Sleep disorder, unspecified: Secondary | ICD-10-CM

## 2021-07-05 LAB — LIPID PANEL
Cholesterol: 196 mg/dL (ref 0–200)
HDL: 64 mg/dL (ref >39.00)
LDL Cholesterol: 120 mg/dL — ABNORMAL HIGH (ref 0–99)
NonHDL: 132.47
Total CHOL/HDL Ratio: 3
Triglycerides: 64 mg/dL (ref 0.0–149.0)
VLDL: 12.8 mg/dL (ref 0.0–40.0)

## 2021-07-05 LAB — HEPATIC FUNCTION PANEL
ALT: 17 U/L (ref 0–35)
AST: 20 U/L (ref 0–37)
Albumin: 4.1 g/dL (ref 3.5–5.2)
Alkaline Phosphatase: 45 U/L (ref 39–117)
Bilirubin, Direct: 0.1 mg/dL (ref 0.0–0.3)
Total Bilirubin: 0.4 mg/dL (ref 0.2–1.2)
Total Protein: 6.4 g/dL (ref 6.0–8.3)

## 2021-07-05 LAB — BASIC METABOLIC PANEL
BUN: 23 mg/dL (ref 6–23)
CO2: 31 mEq/L (ref 19–32)
Calcium: 9.3 mg/dL (ref 8.4–10.5)
Chloride: 103 mEq/L (ref 96–112)
Creatinine, Ser: 0.84 mg/dL (ref 0.40–1.20)
GFR: 72.19 mL/min (ref >60.00)
Glucose, Bld: 81 mg/dL (ref 70–99)
Potassium: 4.1 mEq/L (ref 3.5–5.1)
Sodium: 141 mEq/L (ref 135–145)

## 2021-07-05 LAB — HEMOGLOBIN A1C: Hgb A1c MFr Bld: 6 % (ref 4.6–6.5)

## 2021-07-05 LAB — TSH: TSH: 1.91 u[IU]/mL (ref 0.35–5.50)

## 2021-07-05 NOTE — Assessment & Plan Note (Signed)
Sleep issues as outlined.  Discussed behavior modification - not falling asleep in her chair, etc.  Discussed melatonin.  Follow.

## 2021-07-05 NOTE — Assessment & Plan Note (Addendum)
Physical today 07/05/21.  PAP 04/14/19 - negative with negative HPV.  Mammogram 05/04/21 - Birads I.  Colonoscopy 07/2018 - recommended f/u in 5 years.

## 2021-07-05 NOTE — Assessment & Plan Note (Signed)
On fosamax.  Plan for f/u bone density next year.  Continue calcium, vitamin D and weight bearing exercise.

## 2021-07-05 NOTE — Assessment & Plan Note (Signed)
Follow cbc. Colonoscopy 07/2018.  Recommended f/u in 5 years.   

## 2021-07-05 NOTE — Assessment & Plan Note (Signed)
On lisinopril.  Blood pressure as outlined.  Follow pressures.  Follow metabolic panel.  

## 2021-07-05 NOTE — Progress Notes (Signed)
[name], female   DOB: 09-11-1955, 66 y.o.   MRN: [id number]   Subjective:    [name], female    DOB: 03-09-1956, 66 y.o.   MRN: [id number]  This visit occurred during the SARS-CoV-2 public health emergency.  Safety protocols were in place, including screening questions prior to the visit, additional usage of staff PPE, and extensive cleaning of exam room while observing appropriate contact time as indicated for disinfecting solutions.     HPI With past history of hypertension.  Comes in today to follow up on these issues as well for a complete physical exam.  She is doing relatively well.  Had covid in 01/2021.  No residual problems.  No cough or congestion.  No chest pain or sob.  No acid reflux.  No abdominal pain.  Bowels moving.  Having sleep issues.  Will sleep in her chair for 2-3 hours and then get up to go to bed.  Wakes up around 3 in the morning - mind races.  Back at work.     Past Medical History:  Diagnosis Date   Allergy    Colon polyps    Hearing loss, sensorineural 1993   HSV-1 (herpes simplex virus 1) infection    Hypertension    Osteopenia    Postmenopausal    Past Surgical History:  Procedure Laterality Date   BREAST BIOPSY  1977   Family History  Problem Relation Age of Onset   Heart disease Father        CHF   Diabetes Father    Breast cancer Neg Hx    Social History   Socioeconomic History   Marital status: Married    Spouse name: Not on file   Number of children: Not on file   Years of education: Not on file   Highest education level: Not on file  Occupational History   Not on file  Tobacco Use   Smoking status: Former    Packs/day: 0.50    Types: Cigarettes    Start date: 55    Quit date: 80    Years since quitting: 42.1   Smokeless tobacco: Never  Substance and Sexual Activity   Alcohol use: Yes    Alcohol/week: 0.0 standard drinks   Drug use: No   Sexual activity: Not on file  Other Topics  Concern   Not on file  Social History Narrative   Not on file   Social Determinants of Health   Financial Resource Strain: Not on file  Food Insecurity: Not on file  Transportation Needs: Not on file  Physical Activity: Not on file  Stress: Not on file  Social Connections: Not on file     Review of Systems  Constitutional:  Negative for appetite change and unexpected weight change.  HENT:  Negative for congestion, sinus pressure and sore throat.   Eyes:  Negative for pain and visual disturbance.  Respiratory:  Negative for cough, chest tightness and shortness of breath.   Cardiovascular:  Negative for chest pain, palpitations and leg swelling.  Gastrointestinal:  Negative for abdominal pain, diarrhea, nausea and vomiting.  Genitourinary:  Negative for difficulty urinating and dysuria.  Musculoskeletal:  Negative for joint swelling and myalgias.  Skin:  Negative for color change and rash.  Neurological:  Positive for speech difficulty. Negative for dizziness, light-headedness and headaches.  Hematological:  Negative for adenopathy. Does not bruise/bleed easily.  Psychiatric/Behavioral:  Negative for agitation and dysphoric mood.  Objective:     BP 120/68    Pulse 64    Temp 98 F (36.7 C)    Resp 16    Ht '5\' 4"'  (1.626 m)    Wt 112 lb 3.2 oz (50.9 kg)    LMP 07/26/2003    SpO2 98%    BMI 19.26 kg/m  Wt Readings from Last 3 Encounters:  07/05/21 112 lb 3.2 oz (50.9 kg)  12/24/20 111 lb 3.2 oz (50.4 kg)  05/10/20 113 lb (51.3 kg)    Physical Exam Vitals reviewed.  Constitutional:      General: She is not in acute distress.    Appearance: Normal appearance. She is well-developed.  HENT:     Head: Normocephalic and atraumatic.     Right Ear: External ear normal.     Left Ear: External ear normal.  Eyes:     General: No scleral icterus.       Right eye: No discharge.        Left eye: No discharge.     Conjunctiva/sclera: Conjunctivae normal.  Neck:     Thyroid:  No thyromegaly.  Cardiovascular:     Rate and Rhythm: Normal rate and regular rhythm.  Pulmonary:     Effort: No tachypnea, accessory muscle usage or respiratory distress.     Breath sounds: Normal breath sounds. No decreased breath sounds or wheezing.  Chest:  Breasts:    Right: No inverted nipple, mass, nipple discharge or tenderness (no axillary adenopathy).     Left: No inverted nipple, mass, nipple discharge or tenderness (no axilarry adenopathy).  Abdominal:     General: Bowel sounds are normal.     Palpations: Abdomen is soft.     Tenderness: There is no abdominal tenderness.  Musculoskeletal:        General: No swelling or tenderness.     Cervical back: Neck supple. No tenderness.  Lymphadenopathy:     Cervical: No cervical adenopathy.  Skin:    Findings: No erythema or rash.  Neurological:     Mental Status: She is alert and oriented to person, place, and time.  Psychiatric:        Mood and Affect: Mood normal.        Behavior: Behavior normal.     Outpatient Encounter Medications as of 07/05/2021  Medication Sig   alendronate (FOSAMAX) 70 MG tablet TAKE 1 TABLET BY MOUTH EVERY 7 DAYS. TAKE WITH A FULL GLASS OF WATER ON AN EMPTY STOMACH.   Calcium Citrate (CITRACAL PO) Take 1 tablet by mouth daily.   ibuprofen (ADVIL,MOTRIN) 400 MG tablet Take 1 tablet (400 mg total) by mouth 2 (two) times daily.   lisinopril (ZESTRIL) 10 MG tablet TAKE 1 TABLET BY MOUTH EVERY DAY   Omega-3 Fatty Acids (FISH OIL) 600 MG CAPS Take 1 tablet by mouth daily.   triamcinolone (NASACORT) 55 MCG/ACT nasal inhaler Place 2 sprays into the nose daily.   No facility-administered encounter medications on file as of 07/05/2021.     Lab Results  Component Value Date   WBC 5.3 12/24/2020   HGB 13.3 12/24/2020   HCT 40.4 12/24/2020   PLT 222.0 12/24/2020   GLUCOSE 81 07/05/2021   CHOL 196 07/05/2021   TRIG 64.0 07/05/2021   HDL 64.00 07/05/2021   LDLDIRECT 113.2 08/07/2012   LDLCALC 120 (H)  07/05/2021   ALT 17 07/05/2021   AST 20 07/05/2021   NA 141 07/05/2021   K 4.1 07/05/2021   CL 103  07/05/2021   CREATININE 0.84 07/05/2021   BUN 23 07/05/2021   CO2 31 07/05/2021   TSH 1.91 07/05/2021   HGBA1C 6.0 07/05/2021    MM 3D SCREEN BREAST BILATERAL  Result Date: 05/05/2021 CLINICAL DATA:  Screening. EXAM: DIGITAL SCREENING BILATERAL MAMMOGRAM WITH TOMOSYNTHESIS AND CAD TECHNIQUE: Bilateral screening digital craniocaudal and mediolateral oblique mammograms were obtained. Bilateral screening digital breast tomosynthesis was performed. The images were evaluated with computer-aided detection. COMPARISON:  Previous exam(s). ACR Breast Density Category c: The breast tissue is heterogeneously dense, which may obscure small masses. FINDINGS: There are no findings suspicious for malignancy. IMPRESSION: No mammographic evidence of malignancy. A result letter of this screening mammogram will be mailed directly to the patient. RECOMMENDATION: Screening mammogram in one year. (Code:SM-B-01Y) BI-RADS CATEGORY  1: Negative. Electronically Signed   By: Ammie Ferrier M.D.   On: 05/05/2021 10:32      Assessment & Plan:   Problem List Items Addressed This Visit     Anemia    Follow cbc. Colonoscopy 07/2018.  Recommended f/u in 5 years.        Health care maintenance    Physical today 07/05/21.  PAP 04/14/19 - negative with negative HPV.  Mammogram 05/04/21 - Birads I.  Colonoscopy 07/2018 - recommended f/u in 5 years.        History of colonic polyps    Colonoscopy 07/2018.  Reports no polyps.  Recommended f/u in 5 years.        Hyperglycemia - Primary    Follow met b and a1c.       Relevant Orders   Hemoglobin A1c (Completed)   Hypertension, essential    On lisinopril.  Blood pressure as outlined.  Follow pressures.  Follow metabolic panel.       Relevant Orders   Basic metabolic panel (Completed)   TSH (Completed)   Osteopenia    On fosamax.  Plan for f/u bone density next  year.  Continue calcium, vitamin D and weight bearing exercise.        Sleeping difficulty    Sleep issues as outlined.  Discussed behavior modification - not falling asleep in her chair, etc.  Discussed melatonin.  Follow.        Other Visit Diagnoses     Screening cholesterol level       Relevant Orders   Lipid panel (Completed)   Hepatic function panel (Completed)        Einar Pheasant, MD

## 2021-07-05 NOTE — Assessment & Plan Note (Signed)
Colonoscopy 07/2018.  Reports no polyps.  Recommended f/u in 5 years.   

## 2021-07-05 NOTE — Assessment & Plan Note (Signed)
Follow met b and a1c.  °

## 2021-07-07 ENCOUNTER — Telehealth: Payer: Self-pay | Admitting: Internal Medicine

## 2021-07-07 DIAGNOSIS — R42 Dizziness and giddiness: Secondary | ICD-10-CM

## 2021-07-07 NOTE — Telephone Encounter (Signed)
I am ok with referral to ENT.  Does she feel she needs to be seen or needs anything more?

## 2021-07-07 NOTE — Telephone Encounter (Signed)
Patient called in stated she has another episode of vertigo dizziness and vomiting and patient stated the doctor wanted to be informed on her next episode. Her previous one was last week  Please call patient back @ [phone]

## 2021-07-07 NOTE — Telephone Encounter (Signed)
Called patient to clarify what is going on. Pt stated she had physical with you on Tuesday and had mentioned an episode of dizziness and vomiting (vertigo like symptoms) that she had felt the week prior and was advised to call if happened again. Mentioned possible ENT referral. These symptoms reoccurred yesterday afternoon that lasted for about 3 hours. Confirmed no acute symptoms today. No new or different symptoms from episode last week. Do we need to refer her to ENT? Pt is agreeable.

## 2021-07-08 NOTE — Telephone Encounter (Signed)
Pt returning call. Pt requesting callback.  °

## 2021-07-08 NOTE — Telephone Encounter (Signed)
LMTCB

## 2021-07-08 NOTE — Telephone Encounter (Signed)
Confirmed nothing further needed at this time. Referral placed.

## 2021-07-12 ENCOUNTER — Ambulatory Visit (INDEPENDENT_AMBULATORY_CARE_PROVIDER_SITE_OTHER): Payer: Medicare Other

## 2021-07-12 VITALS — Ht 64.0 in | Wt 112.0 lb

## 2021-07-12 DIAGNOSIS — Z0000 Encounter for general adult medical examination without abnormal findings: Secondary | ICD-10-CM | POA: Diagnosis not present

## 2021-07-12 NOTE — Progress Notes (Signed)
Subjective:   Aimee Gomez is a 66 y.o. female who presents for an Initial Medicare Annual Wellness Visit.  Review of Systems    No ROS.  Medicare Wellness Virtual Visit.  Visual/audio telehealth visit, UTA vital signs.   See social history for additional risk factors.   Cardiac Risk Factors include: advanced age (>67men, >110 women)     Objective:    Today's Vitals   07/12/21 1119  Weight: 112 lb (50.8 kg)  Height: 5\' 4"  (1.626 m)   Body mass index is 19.22 kg/m.  Advanced Directives 07/12/2021 03/28/2018  Does Patient Have a Medical Advance Directive? Yes No  Type of Paramedic of [place];Living will -  Copy of [place] in Chart? No - copy requested -  Would patient like information on creating a medical advance directive? - No - Patient declined    Current Medications (verified) Outpatient Encounter Medications as of 07/12/2021  Medication Sig   alendronate (FOSAMAX) 70 MG tablet TAKE 1 TABLET BY MOUTH EVERY 7 DAYS. TAKE WITH A FULL GLASS OF WATER ON AN EMPTY STOMACH.   Calcium Citrate (CITRACAL PO) Take 1 tablet by mouth daily.   ibuprofen (ADVIL,MOTRIN) 400 MG tablet Take 1 tablet (400 mg total) by mouth 2 (two) times daily.   lisinopril (ZESTRIL) 10 MG tablet TAKE 1 TABLET BY MOUTH EVERY DAY   Omega-3 Fatty Acids (FISH OIL) 600 MG CAPS Take 1 tablet by mouth daily.   triamcinolone (NASACORT) 55 MCG/ACT nasal inhaler Place 2 sprays into the nose daily.   No facility-administered encounter medications on file as of 07/12/2021.    Allergies (verified) Patient has no known allergies.   History: Past Medical History:  Diagnosis Date   Allergy    Colon polyps    Hearing loss, sensorineural 1993   HSV-1 (herpes simplex virus 1) infection    Hypertension    Osteopenia    Postmenopausal    Past Surgical History:  Procedure Laterality Date   BREAST BIOPSY  1977   Family History  Problem Relation Age of Onset    Heart disease Father        CHF   Diabetes Father    Breast cancer Neg Hx    Social History   Socioeconomic History   Marital status: Married    Spouse name: Not on file   Number of children: Not on file   Years of education: Not on file   Highest education level: Not on file  Occupational History   Not on file  Tobacco Use   Smoking status: Former    Packs/day: 0.50    Types: Cigarettes    Start date: 10    Quit date: 62    Years since quitting: 42.1   Smokeless tobacco: Never  Substance and Sexual Activity   Alcohol use: Yes    Alcohol/week: 0.0 standard drinks   Drug use: No   Sexual activity: Not on file  Other Topics Concern   Not on file  Social History Narrative   Not on file   Social Determinants of Health   Financial Resource Strain: Low Risk    Difficulty of Paying Living Expenses: Not hard at all  Food Insecurity: No Food Insecurity   Worried About Charity fundraiser in the Last Year: Never true   [place] in the Last Year: Never true  Transportation Needs: No Transportation Needs   Lack of Transportation (Medical): No  Lack of Transportation (Non-Medical): No  Physical Activity: Sufficiently Active   Days of Exercise per Week: 5 days   Minutes of Exercise per Session: 30 min  Stress: No Stress Concern Present   Feeling of Stress : Not at all  Social Connections: Unknown   Frequency of Communication with Friends and Family: More than three times a week   Frequency of Social Gatherings with Friends and Family: More than three times a week   Attends Religious Services: Not on Electrical engineer or Organizations: Not on file   Attends Archivist Meetings: Not on file   Marital Status: Married    Tobacco Counseling Counseling given: Not Answered   Clinical Intake:  Pre-visit preparation completed: Yes        Diabetes: No  How often do you need to have someone help you when you read instructions, pamphlets,  or other written materials from your doctor or pharmacy?: 1 - Never   Interpreter Needed?: No      Activities of Daily Living In your present state of health, do you have any difficulty performing the following activities: 07/12/2021  Hearing? N  Vision? N  Difficulty concentrating or making decisions? N  Walking or climbing stairs? N  Dressing or bathing? N  Doing errands, shopping? N  Preparing Food and eating ? N  Using the Toilet? N  In the past six months, have you accidently leaked urine? N  Do you have problems with loss of bowel control? N  Managing your Medications? N  Managing your Finances? N  Housekeeping or managing your Housekeeping? N  Some recent data might be hidden    Patient Care Team: Einar Pheasant, MD as PCP - General (Internal Medicine)  Indicate any recent Medical Services you may have received from other than [facility] providers in the past year (date may be approximate).     Assessment:   This is a routine wellness examination for Aimee Gomez.  Virtual Visit via Telephone Note  I connected with  Aimee Gomez on 07/12/21 at 11:15 AM EST by telephone and verified that I am speaking with the correct person using two identifiers.  Persons participating in the virtual visit: patient/Nurse Health Advisor   I discussed the limitations, risks, security and privacy concerns of performing an evaluation and management service by telephone and the availability of in person appointments. The patient expressed understanding and agreed to proceed.  Interactive audio and video telecommunications were attempted between this nurse and patient, however failed, due to patient having technical difficulties OR patient did not have access to video capability.  We continued and completed visit with audio only.  Some vital signs may be absent or patient reported.   Hearing/Vision screen Hearing Screening - Comments:: Hearing impaired L ear Does not wear hearing aid Vision  Screening - Comments:: They have seen their ophthalmologist in the last 12 months.   Dietary issues and exercise activities discussed: Current Exercise Habits: Home exercise routine, Type of exercise: walking;yoga, Frequency (Times/Week): 5, Intensity: Mild   Goals Addressed             This Visit's Progress    Increase physical activity       Walk more for exercise        Depression Screen PHQ 2/9 Scores 07/12/2021 07/05/2021 12/24/2020 05/10/2020 04/14/2019 07/02/2017 01/18/2017  PHQ - 2 Score 0 0 0 0 0 0 0  PHQ- 9 Score - - - - - - 0  Fall Risk Fall Risk  07/12/2021 07/05/2021 12/24/2020 05/10/2020 10/14/2019  Falls in the past year? 0 0 0 0 0  Number falls in past yr: - 0 0 0 -  Injury with Fall? - 0 0 0 -  Follow up Falls evaluation completed Falls evaluation completed Falls evaluation completed Falls evaluation completed Falls evaluation completed    [place]: Home free of loose throw rugs in walkways, pet beds, electrical cords, etc? Yes  Adequate lighting in your home to reduce risk of falls? Yes   ASSISTIVE DEVICES UTILIZED TO PREVENT FALLS: Life alert? No  Use of a cane, walker or w/c? No   TIMED UP AND GO: Was the test performed? No .   Cognitive Function: Patient is alert and oriented x3.  Enjoys teaching, playing piano and other brain health stimulating activities.   MMSE - Mini Mental State Exam 05/10/2020  Orientation to time 5  Orientation to Place 5  Registration 3  Attention/ Calculation 5  Recall 3  Language- name 2 objects 2  Language- repeat 1  Language- follow 3 step command 3  Language- read & follow direction 1  Write a sentence 1  Copy design 1  Total score 30       Immunizations Immunization History  Administered Date(s) Administered   Influenza Split 02/21/2014   Influenza Whole 02/20/2017   Influenza, High Dose Seasonal PF 03/03/2021   Influenza,inj,Quad PF,6+ Mos 02/20/2017, 02/28/2019    Influenza-Unspecified 03/06/2011, 02/22/2016, 02/25/2018   Moderna Covid-19 Vaccine Bivalent Booster 70yrs & up 03/29/2021   PFIZER Comirnaty(Gray Top)Covid-19 Tri-Sucrose Vaccine 09/09/2020   PFIZER(Purple Top)SARS-COV-2 Vaccination 07/17/2019, 08/07/2019   Pneumococcal Conjugate-13 10/16/2019   Pneumococcal Polysaccharide-23 12/28/2020   Td 10/16/2019   Tdap 11/04/2007   Zoster Recombinat (Shingrix) 04/23/2019, 06/25/2019   Screening Tests Health Maintenance  Topic Date Due   MAMMOGRAM  05/04/2022   COLONOSCOPY (Pts 45-80yrs Insurance coverage will need to be confirmed)  01/08/2024   TETANUS/TDAP  10/15/2029   Pneumonia Vaccine 43+ Years old  Completed   INFLUENZA VACCINE  Completed   DEXA SCAN  Completed   COVID-19 Vaccine  Completed   Hepatitis C Screening  Completed   Zoster Vaccines- Shingrix  Completed   HPV VACCINES  Aged Out   Health Maintenance There are no preventive care reminders to display for this patient.  Lung Cancer Screening: (Low Dose CT Chest recommended if Age 72-80 years, 30 pack-year currently smoking OR have quit w/in 15years.) does not qualify.   Vision Screening: Recommended annual ophthalmology exams for early detection of glaucoma and other disorders of the eye.  Dental Screening: Recommended annual dental exams for proper oral hygiene  Community Resource Referral / Chronic Care Management: CRR required this visit?  No   CCM required this visit?  No      Plan:   Keep all routine maintenance appointments.   I have personally reviewed and noted the following in the patients chart:   Medical and social history Use of alcohol, tobacco or illicit drugs  Current medications and supplements including opioid prescriptions. Patient is not currently taking opioid prescriptions. Functional ability and status Nutritional status Physical activity Advanced directives List of other physicians Hospitalizations, surgeries, and ER visits in previous 12  months Vitals Screenings to include cognitive, depression, and falls Referrals and appointments  In addition, I have reviewed and discussed with patient certain preventive protocols, quality metrics, and best practice recommendations. A written personalized care plan for preventive  services as well as general preventive health recommendations were provided to patient via mychart.     Aimee Biles, LPN   10/04/1745

## 2021-07-12 NOTE — Patient Instructions (Addendum)
Aimee Gomez , Thank you for taking time to come for your Medicare Wellness Visit. I appreciate your ongoing commitment to your health goals. Please review the following plan we discussed and let me know if I can assist you in the future.   These are the goals we discussed:  Goals      Increase physical activity     Walk more for exercise         This is a list of the screening recommended for you and due dates:  Health Maintenance  Topic Date Due   Mammogram  05/04/2022   Colon Cancer Screening  01/08/2024   Tetanus Vaccine  10/15/2029   Pneumonia Vaccine  Completed   Flu Shot  Completed   DEXA scan (bone density measurement)  Completed   COVID-19 Vaccine  Completed   Hepatitis C Screening: USPSTF Recommendation to screen - Ages 83-79 yo.  Completed   Zoster (Shingles) Vaccine  Completed   HPV Vaccine  Aged Out    Advanced directives: End of life planning; Advance aging; Advanced directives discussed.  Copy of current HCPOA/Living Will requested.    Conditions/risks identified: none new  Follow up in one year for your annual wellness visit    Preventive Care 65 Years and Older, Female Preventive care refers to lifestyle choices and visits with your health care provider that can promote health and wellness. What does preventive care include? A yearly physical exam. This is also called an annual well check. Dental exams once or twice a year. Routine eye exams. Ask your health care provider how often you should have your eyes checked. Personal lifestyle choices, including: Daily care of your teeth and gums. Regular physical activity. Eating a healthy diet. Avoiding tobacco and drug use. Limiting alcohol use. Practicing safe sex. Taking low-dose aspirin every day. Taking vitamin and mineral supplements as recommended by your health care provider. What happens during an annual well check? The services and screenings done by your health care provider during your annual well  check will depend on your age, overall health, lifestyle risk factors, and family history of disease. Counseling  Your health care provider may ask you questions about your: Alcohol use. Tobacco use. Drug use. Emotional well-being. Home and relationship well-being. Sexual activity. Eating habits. History of falls. Memory and ability to understand (cognition). Work and work Statistician. Reproductive health. Screening  You may have the following tests or measurements: Height, weight, and BMI. Blood pressure. Lipid and cholesterol levels. These may be checked every 5 years, or more frequently if you are over 25 years old. Skin check. Lung cancer screening. You may have this screening every year starting at age 2 if you have a 30-pack-year history of smoking and currently smoke or have quit within the past 15 years. Fecal occult blood test (FOBT) of the stool. You may have this test every year starting at age 46. Flexible sigmoidoscopy or colonoscopy. You may have a sigmoidoscopy every 5 years or a colonoscopy every 10 years starting at age 44. Hepatitis C blood test. Hepatitis B blood test. Sexually transmitted disease (STD) testing. Diabetes screening. This is done by checking your blood sugar (glucose) after you have not eaten for a while (fasting). You may have this done every 1-3 years. Bone density scan. This is done to screen for osteoporosis. You may have this done starting at age 43. Mammogram. This may be done every 1-2 years. Talk to your health care provider about how often you should have regular  mammograms. Talk with your health care provider about your test results, treatment options, and if necessary, the need for more tests. Vaccines  Your health care provider may recommend certain vaccines, such as: Influenza vaccine. This is recommended every year. Tetanus, diphtheria, and acellular pertussis (Tdap, Td) vaccine. You may need a Td booster every 10 years. Zoster  vaccine. You may need this after age 48. Pneumococcal 13-valent conjugate (PCV13) vaccine. One dose is recommended after age 11. Pneumococcal polysaccharide (PPSV23) vaccine. One dose is recommended after age 59. Talk to your health care provider about which screenings and vaccines you need and how often you need them. This information is not intended to replace advice given to you by your health care provider. Make sure you discuss any questions you have with your health care provider. Document Released: 06/18/2015 Document Revised: 02/09/2016 Document Reviewed: 03/23/2015 Elsevier Interactive Patient Education  2017 [place] Prevention in the Home Falls can cause injuries. They can happen to people of all ages. There are many things you can do to make your home safe and to help prevent falls. What can I do on the outside of my home? Regularly fix the edges of walkways and driveways and fix any cracks. Remove anything that might make you trip as you walk through a door, such as a raised step or threshold. Trim any bushes or trees on the path to your home. Use bright outdoor lighting. Clear any walking paths of anything that might make someone trip, such as rocks or tools. Regularly check to see if handrails are loose or broken. Make sure that both sides of any steps have handrails. Any raised decks and porches should have guardrails on the edges. Have any leaves, snow, or ice cleared regularly. Use sand or salt on walking paths during winter. Clean up any spills in your garage right away. This includes oil or grease spills. What can I do in the bathroom? Use night lights. Install grab bars by the toilet and in the tub and shower. Do not use towel bars as grab bars. Use non-skid mats or decals in the tub or shower. If you need to sit down in the shower, use a plastic, non-slip stool. Keep the floor dry. Clean up any water that spills on the floor as soon as it happens. Remove  soap buildup in the tub or shower regularly. Attach bath mats securely with double-sided non-slip rug tape. Do not have throw rugs and other things on the floor that can make you trip. What can I do in the bedroom? Use night lights. Make sure that you have a light by your bed that is easy to reach. Do not use any sheets or blankets that are too big for your bed. They should not hang down onto the floor. Have a firm chair that has side arms. You can use this for support while you get dressed. Do not have throw rugs and other things on the floor that can make you trip. What can I do in the kitchen? Clean up any spills right away. Avoid walking on wet floors. Keep items that you use a lot in easy-to-reach places. If you need to reach something above you, use a strong step stool that has a grab bar. Keep electrical cords out of the way. Do not use floor polish or wax that makes floors slippery. If you must use wax, use non-skid floor wax. Do not have throw rugs and other things on the floor that can  make you trip. What can I do with my stairs? Do not leave any items on the stairs. Make sure that there are handrails on both sides of the stairs and use them. Fix handrails that are broken or loose. Make sure that handrails are as long as the stairways. Check any carpeting to make sure that it is firmly attached to the stairs. Fix any carpet that is loose or worn. Avoid having throw rugs at the top or bottom of the stairs. If you do have throw rugs, attach them to the floor with carpet tape. Make sure that you have a light switch at the top of the stairs and the bottom of the stairs. If you do not have them, ask someone to add them for you. What else can I do to help prevent falls? Wear shoes that: Do not have high heels. Have rubber bottoms. Are comfortable and fit you well. Are closed at the toe. Do not wear sandals. If you use a stepladder: Make sure that it is fully opened. Do not climb a  closed stepladder. Make sure that both sides of the stepladder are locked into place. Ask someone to hold it for you, if possible. Clearly mark and make sure that you can see: Any grab bars or handrails. First and last steps. Where the edge of each step is. Use tools that help you move around (mobility aids) if they are needed. These include: Canes. Walkers. Scooters. Crutches. Turn on the lights when you go into a dark area. Replace any light bulbs as soon as they burn out. Set up your furniture so you have a clear path. Avoid moving your furniture around. If any of your floors are uneven, fix them. If there are any pets around you, be aware of where they are. Review your medicines with your doctor. Some medicines can make you feel dizzy. This can increase your chance of falling. Ask your doctor what other things that you can do to help prevent falls. This information is not intended to replace advice given to you by your health care provider. Make sure you discuss any questions you have with your health care provider. Document Released: 03/18/2009 Document Revised: 10/28/2015 Document Reviewed: 06/26/2014 Elsevier Interactive Patient Education  2017 [employer].

## 2021-11-27 ENCOUNTER — Other Ambulatory Visit: Payer: Self-pay | Admitting: Internal Medicine

## 2022-01-04 ENCOUNTER — Encounter: Payer: Self-pay | Admitting: Internal Medicine

## 2022-01-04 ENCOUNTER — Ambulatory Visit (INDEPENDENT_AMBULATORY_CARE_PROVIDER_SITE_OTHER): Payer: Medicare Other | Admitting: Internal Medicine

## 2022-01-04 VITALS — BP 120/76 | HR 67 | Temp 97.7°F | Resp 17 | Ht 64.0 in | Wt 112.8 lb

## 2022-01-04 DIAGNOSIS — G479 Sleep disorder, unspecified: Secondary | ICD-10-CM

## 2022-01-04 DIAGNOSIS — D649 Anemia, unspecified: Secondary | ICD-10-CM

## 2022-01-04 DIAGNOSIS — Z1231 Encounter for screening mammogram for malignant neoplasm of breast: Secondary | ICD-10-CM

## 2022-01-04 DIAGNOSIS — Z86010 Personal history of colonic polyps: Secondary | ICD-10-CM

## 2022-01-04 DIAGNOSIS — Z13220 Encounter for screening for lipoid disorders: Secondary | ICD-10-CM | POA: Diagnosis not present

## 2022-01-04 DIAGNOSIS — I10 Essential (primary) hypertension: Secondary | ICD-10-CM

## 2022-01-04 DIAGNOSIS — R739 Hyperglycemia, unspecified: Secondary | ICD-10-CM

## 2022-01-04 LAB — CBC WITH DIFFERENTIAL/PLATELET
Basophils Absolute: 0 10*3/uL (ref 0.0–0.1)
Basophils Relative: 0.6 % (ref 0.0–3.0)
Eosinophils Absolute: 0.2 10*3/uL (ref 0.0–0.7)
Eosinophils Relative: 3.1 % (ref 0.0–5.0)
HCT: 40.7 % (ref 36.0–46.0)
Hemoglobin: 13.4 g/dL (ref 12.0–15.0)
Lymphocytes Relative: 31.1 % (ref 12.0–46.0)
Lymphs Abs: 1.8 10*3/uL (ref 0.7–4.0)
MCHC: 32.9 g/dL (ref 30.0–36.0)
MCV: 89.8 fl (ref 78.0–100.0)
Monocytes Absolute: 0.4 10*3/uL (ref 0.1–1.0)
Monocytes Relative: 6.6 % (ref 3.0–12.0)
Neutro Abs: 3.4 10*3/uL (ref 1.4–7.7)
Neutrophils Relative %: 58.6 % (ref 43.0–77.0)
Platelets: 210 10*3/uL (ref 150.0–400.0)
RBC: 4.54 Mil/uL (ref 3.87–5.11)
RDW: 14.3 % (ref 11.5–15.5)
WBC: 5.9 10*3/uL (ref 4.0–10.5)

## 2022-01-04 LAB — HEPATIC FUNCTION PANEL
ALT: 15 U/L (ref 0–35)
AST: 19 U/L (ref 0–37)
Albumin: 4.2 g/dL (ref 3.5–5.2)
Alkaline Phosphatase: 45 U/L (ref 39–117)
Bilirubin, Direct: 0.1 mg/dL (ref 0.0–0.3)
Total Bilirubin: 0.5 mg/dL (ref 0.2–1.2)
Total Protein: 6.5 g/dL (ref 6.0–8.3)

## 2022-01-04 LAB — BASIC METABOLIC PANEL
BUN: 22 mg/dL (ref 6–23)
CO2: 31 mEq/L (ref 19–32)
Calcium: 9.4 mg/dL (ref 8.4–10.5)
Chloride: 101 mEq/L (ref 96–112)
Creatinine, Ser: 0.87 mg/dL (ref 0.40–1.20)
GFR: 68.97 mL/min (ref >60.00)
Glucose, Bld: 79 mg/dL (ref 70–99)
Potassium: 4.2 mEq/L (ref 3.5–5.1)
Sodium: 139 mEq/L (ref 135–145)

## 2022-01-04 LAB — LIPID PANEL
Cholesterol: 207 mg/dL — ABNORMAL HIGH (ref 0–200)
HDL: 64.4 mg/dL (ref >39.00)
LDL Cholesterol: 131 mg/dL — ABNORMAL HIGH (ref 0–99)
NonHDL: 142.74
Total CHOL/HDL Ratio: 3
Triglycerides: 60 mg/dL (ref 0.0–149.0)
VLDL: 12 mg/dL (ref 0.0–40.0)

## 2022-01-04 LAB — HEMOGLOBIN A1C: Hgb A1c MFr Bld: 6.1 % (ref 4.6–6.5)

## 2022-01-04 NOTE — Progress Notes (Unsigned)
[name], female   DOB: 1955/10/24, 66 y.o.   MRN: [id number]   Subjective:    [name], female    DOB: [birth year], 66 y.o.   MRN: [id number]   Patient here for a scheduled follow up.   Chief Complaint  Patient presents with   Hypertension   Anemia   .   Hypertension Pertinent negatives include no chest pain, headaches, palpitations or shortness of breath.  Anemia There has been no abdominal pain, light-headedness or palpitations.   She reports she is doing well.  Feels good.  Stays active.  Planning to travel to [place] in September.  Discussed vaccines.  No chest pain or sob reported.  No abdominal pain or bowel change reported.  Overall feels good.  No further dizziness.  [facility]  Has not had issues since.     Past Medical History:  Diagnosis Date   Allergy    Colon polyps    Hearing loss, sensorineural 1993   HSV-1 (herpes simplex virus 1) infection    Hypertension    Osteopenia    Postmenopausal    Past Surgical History:  Procedure Laterality Date   BREAST BIOPSY  1977   Family History  Problem Relation Age of Onset   Heart disease Father        CHF   Diabetes Father    Breast cancer Neg Hx    Social History   Socioeconomic History   Marital status: Married    Spouse name: Not on file   Number of children: Not on file   Years of education: Not on file   Highest education level: Not on file  Occupational History   Not on file  Tobacco Use   Smoking status: Former    Packs/day: 0.50    Types: Cigarettes    Start date: 90    Quit date: 101    Years since quitting: 42.6   Smokeless tobacco: Never  Substance and Sexual Activity   Alcohol use: Yes    Alcohol/week: 0.0 standard drinks of alcohol   Drug use: No   Sexual activity: Not on file  Other Topics Concern   Not on file  Social History Narrative   Not on file   Social Determinants of Health   Financial  Resource Strain: Low Risk  (07/12/2021)   Overall Financial Resource Strain (CARDIA)    Difficulty of Paying Living Expenses: Not hard at all  Food Insecurity: No Food Insecurity (07/12/2021)   Hunger Vital Sign    Worried About Running Out of Food in the Last Year: Never true    Ran Out of Food in the Last Year: Never true  Transportation Needs: No Transportation Needs (07/12/2021)   PRAPARE - Hydrologist (Medical): No    Lack of Transportation (Non-Medical): No  Physical Activity: Sufficiently Active (07/12/2021)   Exercise Vital Sign    Days of Exercise per Week: 5 days    Minutes of Exercise per Session: 30 min  Stress: No Stress Concern Present (07/12/2021)   [place]    Feeling of Stress : Not at all  Social Connections: Unknown (07/12/2021)   Social Connection and Isolation Panel [NHANES]    Frequency of Communication with Friends and Family: More than three times a week    Frequency of Social Gatherings with Friends and Family: More than three times  a week    Attends Religious Services: Not on file    Active Member of Clubs or Organizations: Not on file    Attends Archivist Meetings: Not on file    Marital Status: Married     Review of Systems  Constitutional:  Negative for appetite change and unexpected weight change.  HENT:  Negative for congestion and sinus pressure.   Respiratory:  Negative for cough, chest tightness and shortness of breath.   Cardiovascular:  Negative for chest pain, palpitations and leg swelling.  Gastrointestinal:  Negative for abdominal pain, diarrhea, nausea and vomiting.  Genitourinary:  Negative for difficulty urinating and dysuria.  Musculoskeletal:  Negative for joint swelling and myalgias.  Skin:  Negative for color change and rash.  Neurological:  Negative for dizziness, light-headedness and headaches.  Psychiatric/Behavioral:  Negative for  agitation and dysphoric mood.        Objective:     BP 120/76 (BP Location: Left Arm, Patient Position: Sitting, Cuff Size: Small)   Pulse 67   Temp 97.7 F (36.5 C) (Temporal)   Resp 17   Ht _0  (1.626 m)   Wt 112 lb 12.8 oz (51.2 kg)   LMP 07/26/2003   SpO2 99%   BMI 19.36 kg/m  Wt Readings from Last 3 Encounters:  01/04/22 112 lb 12.8 oz (51.2 kg)  07/12/21 112 lb (50.8 kg)  07/05/21 112 lb 3.2 oz (50.9 kg)    Physical Exam Vitals reviewed.  Constitutional:      General: She is not in acute distress.    Appearance: Normal appearance.  HENT:     Head: Normocephalic and atraumatic.     Right Ear: External ear normal.     Left Ear: External ear normal.  Eyes:     General: No scleral icterus.       Right eye: No discharge.        Left eye: No discharge.     Conjunctiva/sclera: Conjunctivae normal.  Neck:     Thyroid: No thyromegaly.  Cardiovascular:     Rate and Rhythm: Normal rate and regular rhythm.  Pulmonary:     Effort: No respiratory distress.     Breath sounds: Normal breath sounds. No wheezing.  Abdominal:     General: Bowel sounds are normal.     Palpations: Abdomen is soft.     Tenderness: There is no abdominal tenderness.  Musculoskeletal:        General: No swelling or tenderness.     Cervical back: Neck supple. No tenderness.  Lymphadenopathy:     Cervical: No cervical adenopathy.  Skin:    Findings: No erythema or rash.  Neurological:     Mental Status: She is alert.  Psychiatric:        Mood and Affect: Mood normal.        Behavior: Behavior normal.      Outpatient Encounter Medications as of 01/04/2022  Medication Sig   alendronate (FOSAMAX) 70 MG tablet TAKE 1 TABLET BY MOUTH EVERY 7 DAYS. TAKE WITH A FULL GLASS OF WATER ON AN EMPTY STOMACH.   Calcium Citrate (CITRACAL PO) Take 1 tablet by mouth daily.   lisinopril (ZESTRIL) 10 MG tablet TAKE 1 TABLET BY MOUTH EVERY DAY   Omega-3 Fatty Acids (FISH OIL) 600 MG CAPS Take 1 tablet by  mouth daily.   triamcinolone (NASACORT) 55 MCG/ACT nasal inhaler Place 2 sprays into the nose daily.   [DISCONTINUED] ibuprofen (ADVIL,MOTRIN) 400 MG tablet Take 1  tablet (400 mg total) by mouth 2 (two) times daily.   No facility-administered encounter medications on file as of 01/04/2022.     Lab Results  Component Value Date   WBC 5.9 01/04/2022   HGB 13.4 01/04/2022   HCT 40.7 01/04/2022   PLT 210.0 01/04/2022   GLUCOSE 79 01/04/2022   CHOL 207 (H) 01/04/2022   TRIG 60.0 01/04/2022   HDL 64.40 01/04/2022   LDLDIRECT 113.2 08/07/2012   LDLCALC 131 (H) 01/04/2022   ALT 15 01/04/2022   AST 19 01/04/2022   NA 139 01/04/2022   K 4.2 01/04/2022   CL 101 01/04/2022   CREATININE 0.87 01/04/2022   BUN 22 01/04/2022   CO2 31 01/04/2022   TSH 1.91 07/05/2021   HGBA1C 6.1 01/04/2022    MM 3D SCREEN BREAST BILATERAL  Result Date: 05/05/2021 CLINICAL DATA:  Screening. EXAM: DIGITAL SCREENING BILATERAL MAMMOGRAM WITH TOMOSYNTHESIS AND CAD TECHNIQUE: Bilateral screening digital craniocaudal and mediolateral oblique mammograms were obtained. Bilateral screening digital breast tomosynthesis was performed. The images were evaluated with computer-aided detection. COMPARISON:  Previous exam(s). ACR Breast Density Category c: The breast tissue is heterogeneously dense, which may obscure small masses. FINDINGS: There are no findings suspicious for malignancy. IMPRESSION: No mammographic evidence of malignancy. A result letter of this screening mammogram will be mailed directly to the patient. RECOMMENDATION: Screening mammogram in one year. (Code:SM-B-01Y) BI-RADS CATEGORY  1: Negative. Electronically Signed   By: Ammie Ferrier M.D.   On: 05/05/2021 10:32      Assessment & Plan:   Problem List Items Addressed This Visit     Anemia    Follow cbc. Colonoscopy 07/2018.  Recommended f/u in 5 years.        Relevant Orders   CBC w/Diff (Completed)   History of colonic polyps    Colonoscopy  07/2018.  Reports no polyps.  Recommended f/u in 5 years.        Hyperglycemia - Primary    Follow met b and a1c.       Relevant Orders   HgB A1c (Completed)   Hypertension, essential    On lisinopril.  Blood pressure as outlined.  Follow pressures.  Follow metabolic panel.       Relevant Orders   Basic Metabolic Panel (BMET) (Completed)   Hepatic function panel (Completed)   Sleeping difficulty    Took melatonin for a couple of weeks after last visit.  Off now.  Sleeping better.  Follow.       Other Visit Diagnoses     Screening cholesterol level       Relevant Orders   Lipid Profile (Completed)   Encounter for screening mammogram for malignant neoplasm of breast       Relevant Orders   MM 3D SCREEN BREAST BILATERAL        Einar Pheasant, MD

## 2022-01-05 ENCOUNTER — Encounter: Payer: Self-pay | Admitting: Internal Medicine

## 2022-01-05 NOTE — Assessment & Plan Note (Signed)
Follow cbc. Colonoscopy 07/2018.  Recommended f/u in 5 years.   

## 2022-01-05 NOTE — Assessment & Plan Note (Signed)
On lisinopril.  Blood pressure as outlined.  Follow pressures.  Follow metabolic panel.  

## 2022-01-05 NOTE — Assessment & Plan Note (Signed)
Took melatonin for a couple of weeks after last visit.  Off now.  Sleeping better.  Follow.

## 2022-01-05 NOTE — Assessment & Plan Note (Signed)
Follow met b and a1c.  

## 2022-01-05 NOTE — Assessment & Plan Note (Signed)
Colonoscopy 07/2018.  Reports no polyps.  Recommended f/u in 5 years.   

## 2022-01-06 ENCOUNTER — Other Ambulatory Visit: Payer: Self-pay

## 2022-01-06 DIAGNOSIS — E785 Hyperlipidemia, unspecified: Secondary | ICD-10-CM

## 2022-01-06 MED ORDER — ROSUVASTATIN CALCIUM 5 MG PO TABS: 5.0000 mg | ORAL_TABLET | Freq: Every day | ORAL | 1 refills | Status: DC

## 2022-02-25 ENCOUNTER — Other Ambulatory Visit: Payer: Self-pay | Admitting: Internal Medicine

## 2022-03-06 ENCOUNTER — Other Ambulatory Visit (INDEPENDENT_AMBULATORY_CARE_PROVIDER_SITE_OTHER): Payer: Medicare Other

## 2022-03-06 DIAGNOSIS — E785 Hyperlipidemia, unspecified: Secondary | ICD-10-CM

## 2022-03-06 LAB — HEPATIC FUNCTION PANEL
ALT: 22 U/L (ref 0–35)
AST: 24 U/L (ref 0–37)
Albumin: 4 g/dL (ref 3.5–5.2)
Alkaline Phosphatase: 50 U/L (ref 39–117)
Bilirubin, Direct: 0.1 mg/dL (ref 0.0–0.3)
Total Bilirubin: 0.4 mg/dL (ref 0.2–1.2)
Total Protein: 6.7 g/dL (ref 6.0–8.3)

## 2022-04-15 ENCOUNTER — Encounter: Payer: Self-pay | Admitting: Internal Medicine

## 2022-04-15 DIAGNOSIS — H02836 Dermatochalasis of left eye, unspecified eyelid: Secondary | ICD-10-CM | POA: Insufficient documentation

## 2022-05-05 ENCOUNTER — Ambulatory Visit
Admission: RE | Admit: 2022-05-05 | Discharge: 2022-05-05 | Disposition: A | Discharge status: Home / Self Care | Payer: Medicare Other | Source: Ambulatory Visit | Attending: Internal Medicine | Admitting: Internal Medicine

## 2022-05-05 DIAGNOSIS — Z1231 Encounter for screening mammogram for malignant neoplasm of breast: Secondary | ICD-10-CM | POA: Diagnosis not present

## 2022-05-28 ENCOUNTER — Other Ambulatory Visit: Payer: Self-pay | Admitting: Family

## 2022-06-18 ENCOUNTER — Other Ambulatory Visit: Payer: Self-pay | Admitting: Internal Medicine

## 2022-06-30 ENCOUNTER — Telehealth: Payer: Self-pay | Admitting: Internal Medicine

## 2022-06-30 NOTE — Telephone Encounter (Signed)
Left message for patient to call back and schedule Medicare Annual Wellness Visit (AWV.   Please offer to do by telephone.  Left office number and my jabber #[phone].  Last AWV:07/12/2021   Please schedule at anytime with Nurse Health Advisor.  

## 2022-07-07 ENCOUNTER — Ambulatory Visit (INDEPENDENT_AMBULATORY_CARE_PROVIDER_SITE_OTHER): Payer: Medicare Other | Admitting: Internal Medicine

## 2022-07-07 ENCOUNTER — Encounter: Payer: Self-pay | Admitting: Internal Medicine

## 2022-07-07 VITALS — BP 130/70 | HR 61 | Temp 97.9°F | Resp 16 | Ht 63.5 in | Wt 113.6 lb

## 2022-07-07 DIAGNOSIS — R739 Hyperglycemia, unspecified: Secondary | ICD-10-CM

## 2022-07-07 DIAGNOSIS — E2839 Other primary ovarian failure: Secondary | ICD-10-CM

## 2022-07-07 DIAGNOSIS — H02836 Dermatochalasis of left eye, unspecified eyelid: Secondary | ICD-10-CM

## 2022-07-07 DIAGNOSIS — Z86010 Personal history of colonic polyps: Secondary | ICD-10-CM

## 2022-07-07 DIAGNOSIS — I10 Essential (primary) hypertension: Secondary | ICD-10-CM

## 2022-07-07 DIAGNOSIS — D649 Anemia, unspecified: Secondary | ICD-10-CM | POA: Diagnosis not present

## 2022-07-07 DIAGNOSIS — H02833 Dermatochalasis of right eye, unspecified eyelid: Secondary | ICD-10-CM

## 2022-07-07 DIAGNOSIS — E785 Hyperlipidemia, unspecified: Secondary | ICD-10-CM

## 2022-07-07 DIAGNOSIS — M25572 Pain in left ankle and joints of left foot: Secondary | ICD-10-CM

## 2022-07-07 DIAGNOSIS — Z0000 Encounter for general adult medical examination without abnormal findings: Secondary | ICD-10-CM

## 2022-07-07 LAB — HEPATIC FUNCTION PANEL
ALT: 15 U/L (ref 0–35)
AST: 20 U/L (ref 0–37)
Albumin: 4.3 g/dL (ref 3.5–5.2)
Alkaline Phosphatase: 45 U/L (ref 39–117)
Bilirubin, Direct: 0.1 mg/dL (ref 0.0–0.3)
Total Bilirubin: 0.4 mg/dL (ref 0.2–1.2)
Total Protein: 6.7 g/dL (ref 6.0–8.3)

## 2022-07-07 LAB — LIPID PANEL
Cholesterol: 150 mg/dL (ref 0–200)
HDL: 67.7 mg/dL (ref >39.00)
LDL Cholesterol: 71 mg/dL (ref 0–99)
NonHDL: 82.16
Total CHOL/HDL Ratio: 2
Triglycerides: 56 mg/dL (ref 0.0–149.0)
VLDL: 11.2 mg/dL (ref 0.0–40.0)

## 2022-07-07 LAB — BASIC METABOLIC PANEL
BUN: 23 mg/dL (ref 6–23)
CO2: 31 mEq/L (ref 19–32)
Calcium: 9.6 mg/dL (ref 8.4–10.5)
Chloride: 101 mEq/L (ref 96–112)
Creatinine, Ser: 0.85 mg/dL (ref 0.40–1.20)
GFR: 70.68 mL/min (ref >60.00)
Glucose, Bld: 81 mg/dL (ref 70–99)
Potassium: 4.6 mEq/L (ref 3.5–5.1)
Sodium: 141 mEq/L (ref 135–145)

## 2022-07-07 LAB — TSH: TSH: 2.37 u[IU]/mL (ref 0.35–5.50)

## 2022-07-07 LAB — HEMOGLOBIN A1C: Hgb A1c MFr Bld: 5.9 % (ref 4.6–6.5)

## 2022-07-07 MED ORDER — LISINOPRIL 10 MG PO TABS: 10.0000 mg | ORAL_TABLET | Freq: Every day | ORAL | 2 refills | Status: DC

## 2022-07-07 MED ORDER — ALENDRONATE SODIUM 70 MG PO TABS: ORAL_TABLET | ORAL | 3 refills | Status: DC

## 2022-07-07 NOTE — Progress Notes (Signed)
Subjective:    [name], female    DOB: September 09, 1955, 67 y.o.   MRN: [id number]  Patient here for  Chief Complaint  Patient presents with   Annual Exam    HPI With past history of hypertension and anemia.  She is here to follow up on these issues as well as for a complete physical exam.  She stays active.  No chest pain or sob reported.  No cough or congestion.  No acid reflux.  No abdominal pain or cramping.  Did fall yesterday.  Jumped up fast.  Shoe caught.  Injured left ankle.  Some bruising.  Soreness to touch.  No pain with walking.  No increased soft tissue swelling.     Past Medical History:  Diagnosis Date   Allergy    Colon polyps    Hearing loss, sensorineural 1993   HSV-1 (herpes simplex virus 1) infection    Hypertension    Osteopenia    Postmenopausal    Past Surgical History:  Procedure Laterality Date   BREAST BIOPSY  1977   Family History  Problem Relation Age of Onset   Heart disease Father        CHF   Diabetes Father    Breast cancer Neg Hx    Social History   Socioeconomic History   Marital status: Married    Spouse name: Not on file   Number of children: Not on file   Years of education: Not on file   Highest education level: Not on file  Occupational History   Not on file  Tobacco Use   Smoking status: Former    Packs/day: 0.50    Types: Cigarettes    Start date: 63    Quit date: 31    Years since quitting: 43.1   Smokeless tobacco: Never  Substance and Sexual Activity   Alcohol use: Yes    Alcohol/week: 0.0 standard drinks of alcohol   Drug use: No   Sexual activity: Not on file  Other Topics Concern   Not on file  Social History Narrative   Not on file   Social Determinants of Health   Financial Resource Strain: Low Risk  (07/12/2021)   Overall Financial Resource Strain (CARDIA)    Difficulty of Paying Living Expenses: Not hard at all  Food Insecurity: No Food Insecurity (07/12/2021)   Hunger Vital Sign     Worried About Running Out of Food in the Last Year: Never true    Ran Out of Food in the Last Year: Never true  Transportation Needs: No Transportation Needs (07/12/2021)   PRAPARE - Administrator, Civil Service (Medical): No    Lack of Transportation (Non-Medical): No  Physical Activity: Sufficiently Active (07/12/2021)   Exercise Vital Sign    Days of Exercise per Week: 5 days    Minutes of Exercise per Session: 30 min  Stress: No Stress Concern Present (07/12/2021)   [employer] of Occupational Health - Occupational Stress Questionnaire    Feeling of Stress : Not at all  Social Connections: Unknown (07/12/2021)   Social Connection and Isolation Panel [NHANES]    Frequency of Communication with Friends and Family: More than three times a week    Frequency of Social Gatherings with Friends and Family: More than three times a week    Attends Religious Services: Not on file    Active Member of Clubs or Organizations: Not on file    Attends Club or  Organization Meetings: Not on file    Marital Status: Married     Review of Systems  Constitutional:  Negative for appetite change and unexpected weight change.  HENT:  Negative for congestion, sinus pressure and sore throat.   Eyes:  Negative for pain and visual disturbance.  Respiratory:  Negative for cough, chest tightness and shortness of breath.   Cardiovascular:  Negative for chest pain, palpitations and leg swelling.  Gastrointestinal:  Negative for abdominal pain, diarrhea, nausea and vomiting.  Genitourinary:  Negative for difficulty urinating and dysuria.  Musculoskeletal:  Negative for joint swelling and myalgias.       Ankle pain and bruising from recent fall as outlined.   Skin:  Negative for color change and rash.  Neurological:  Negative for dizziness and headaches.  Hematological:  Negative for adenopathy. Does not bruise/bleed easily.  Psychiatric/Behavioral:  Negative for agitation and dysphoric mood.         Objective:     BP 130/70   Pulse 61   Temp 97.9 F (36.6 C)   Resp 16   Ht 5' 3.5" (1.613 m)   Wt 113 lb 9.6 oz (51.5 kg)   LMP 07/26/2003   SpO2 98%   BMI 19.81 kg/m  Wt Readings from Last 3 Encounters:  07/07/22 113 lb 9.6 oz (51.5 kg)  01/04/22 112 lb 12.8 oz (51.2 kg)  07/12/21 112 lb (50.8 kg)    Physical Exam Vitals reviewed.  Constitutional:      General: She is not in acute distress.    Appearance: Normal appearance. She is well-developed.  HENT:     Head: Normocephalic and atraumatic.     Right Ear: External ear normal.     Left Ear: External ear normal.  Eyes:     General: No scleral icterus.       Right eye: No discharge.        Left eye: No discharge.     Conjunctiva/sclera: Conjunctivae normal.  Neck:     Thyroid: No thyromegaly.  Cardiovascular:     Rate and Rhythm: Normal rate and regular rhythm.  Pulmonary:     Effort: No tachypnea, accessory muscle usage or respiratory distress.     Breath sounds: Normal breath sounds. No decreased breath sounds or wheezing.  Chest:  Breasts:    Right: No inverted nipple, mass, nipple discharge or tenderness (no axillary adenopathy).     Left: No inverted nipple, mass, nipple discharge or tenderness (no axilarry adenopathy).  Abdominal:     General: Bowel sounds are normal.     Palpations: Abdomen is soft.     Tenderness: There is no abdominal tenderness.  Musculoskeletal:        General: No swelling or tenderness.     Cervical back: Neck supple.     Comments: Some tenderness to palpation left ankle.  Some bruising.  No pain with weight bearing.   Lymphadenopathy:     Cervical: No cervical adenopathy.  Skin:    Findings: No erythema or rash.  Neurological:     Mental Status: She is alert and oriented to person, place, and time.  Psychiatric:        Mood and Affect: Mood normal.        Behavior: Behavior normal.      Outpatient Encounter Medications as of 07/07/2022  Medication Sig    alendronate (FOSAMAX) 70 MG tablet TAKE 1 TABLET BY MOUTH EVERY 7 DAYS. TAKE WITH A FULL GLASS OF WATER ON AN  EMPTY STOMACH.   Calcium Citrate (CITRACAL PO) Take 1 tablet by mouth daily.   lisinopril (ZESTRIL) 10 MG tablet Take 1 tablet (10 mg total) by mouth daily.   Omega-3 Fatty Acids (FISH OIL) 600 MG CAPS Take 1 tablet by mouth daily.   rosuvastatin (CRESTOR) 5 MG tablet TAKE 1 TABLET (5 MG TOTAL) BY MOUTH DAILY.   triamcinolone (NASACORT) 55 MCG/ACT nasal inhaler Place 2 sprays into the nose daily.   [DISCONTINUED] alendronate (FOSAMAX) 70 MG tablet TAKE 1 TABLET BY MOUTH EVERY 7 DAYS. TAKE WITH A FULL GLASS OF WATER ON AN EMPTY STOMACH.   [DISCONTINUED] lisinopril (ZESTRIL) 10 MG tablet TAKE 1 TABLET BY MOUTH EVERY DAY   No facility-administered encounter medications on file as of 07/07/2022.     Lab Results  Component Value Date   WBC 5.9 01/04/2022   HGB 13.4 01/04/2022   HCT 40.7 01/04/2022   PLT 210.0 01/04/2022   GLUCOSE 81 07/07/2022   CHOL 150 07/07/2022   TRIG 56.0 07/07/2022   HDL 67.70 07/07/2022   LDLDIRECT 113.2 08/07/2012   LDLCALC 71 07/07/2022   ALT 15 07/07/2022   AST 20 07/07/2022   NA 141 07/07/2022   K 4.6 07/07/2022   CL 101 07/07/2022   CREATININE 0.85 07/07/2022   BUN 23 07/07/2022   CO2 31 07/07/2022   TSH 2.37 07/07/2022   HGBA1C 5.9 07/07/2022    MM 3D SCREEN BREAST BILATERAL  Result Date: 05/08/2022 CLINICAL DATA:  Screening. EXAM: DIGITAL SCREENING BILATERAL MAMMOGRAM WITH TOMOSYNTHESIS AND CAD TECHNIQUE: Bilateral screening digital craniocaudal and mediolateral oblique mammograms were obtained. Bilateral screening digital breast tomosynthesis was performed. The images were evaluated with computer-aided detection. COMPARISON:  Previous exam(s). ACR Breast Density Category c: The breast tissue is heterogeneously dense, which may obscure small masses. FINDINGS: There are no findings suspicious for malignancy. IMPRESSION: No mammographic evidence of  malignancy. A result letter of this screening mammogram will be mailed directly to the patient. RECOMMENDATION: Screening mammogram in one year. (Code:SM-B-01Y) BI-RADS CATEGORY  1: Negative. Electronically Signed   By: Annia Belt M.D.   On: 05/08/2022 08:39       Assessment & Plan:  Hyperlipidemia, unspecified hyperlipidemia type -     Lipid panel -     Hepatic function panel  Hyperglycemia Assessment & Plan: Follow met b and a1c.   Orders: -     Hemoglobin A1c  Anemia, unspecified type Assessment & Plan: Follow cbc. Colonoscopy 07/2018.  Recommended f/u in 5 years.    Orders: -     Basic metabolic panel -     TSH  Health care maintenance Assessment & Plan: Physical today 07/07/22.  PAP 04/14/19 - negative with negative HPV.  Mammogram 05/05/22 - Birads I.  Colonoscopy 07/2018 - recommended f/u in 5 years.     Estrogen deficiency -     DG Bone Density; Future  Dermatochalasis of eyelids of both eyes, unspecified eyelid Assessment & Plan: S/p blepharoplasty upper and rhytidectomy (forehead). (04/05/22)   History of colonic polyps Assessment & Plan: Colonoscopy 07/2018.  Reports no polyps.  Recommended f/u in 5 years.     Hypertension, essential Assessment & Plan: On lisinopril.  Blood pressure as outlined.  Follow pressures.  Follow metabolic panel.    Acute left ankle pain Assessment & Plan: S/p injury as outlined.  Ice.  Elevation.  Discussed further evaluation - xray. Wants to monitor.  Will notify me if persistent problems.    Other orders -  Lisinopril; Take 1 tablet (10 mg total) by mouth daily.  Dispense: 90 tablet; Refill: 2 -     Alendronate Sodium; TAKE 1 TABLET BY MOUTH EVERY 7 DAYS. TAKE WITH A FULL GLASS OF WATER ON AN EMPTY STOMACH.  Dispense: 12 tablet; Refill: 3     Dale O'Donnell, MD

## 2022-07-07 NOTE — Assessment & Plan Note (Signed)
Physical today 07/07/22.  PAP 04/14/19 - negative with negative HPV.  Mammogram 05/05/22 - Birads I.  Colonoscopy 07/2018 - recommended f/u in 5 years.

## 2022-07-09 ENCOUNTER — Encounter: Payer: Self-pay | Admitting: Internal Medicine

## 2022-07-09 DIAGNOSIS — M25572 Pain in left ankle and joints of left foot: Secondary | ICD-10-CM | POA: Insufficient documentation

## 2022-07-09 NOTE — Assessment & Plan Note (Signed)
On lisinopril.  Blood pressure as outlined.  Follow pressures.  Follow metabolic panel.

## 2022-07-09 NOTE — Assessment & Plan Note (Signed)
S/p injury as outlined.  Ice.  Elevation.  Discussed further evaluation - xray. Wants to monitor.  Will notify me if persistent problems.

## 2022-07-09 NOTE — Assessment & Plan Note (Signed)
S/p blepharoplasty upper and rhytidectomy (forehead). (04/05/22)

## 2022-07-09 NOTE — Assessment & Plan Note (Signed)
Follow cbc. Colonoscopy 07/2018.  Recommended f/u in 5 years.

## 2022-07-09 NOTE — Assessment & Plan Note (Signed)
Follow met b and a1c.  

## 2022-07-09 NOTE — Assessment & Plan Note (Signed)
Colonoscopy 07/2018.  Reports no polyps.  Recommended f/u in 5 years.

## 2022-07-17 ENCOUNTER — Ambulatory Visit (INDEPENDENT_AMBULATORY_CARE_PROVIDER_SITE_OTHER): Payer: Medicare Other

## 2022-07-17 VITALS — Ht 63.5 in | Wt 113.0 lb

## 2022-07-17 DIAGNOSIS — Z0000 Encounter for general adult medical examination without abnormal findings: Secondary | ICD-10-CM

## 2022-07-17 NOTE — Progress Notes (Signed)
Subjective:   Aimee Gomez is a 67 y.o. female who presents for Medicare Annual (Subsequent) preventive examination.  Review of Systems    No ROS.  Medicare Wellness Virtual Visit.  Visual/audio telehealth visit, UTA vital signs.   See social history for additional risk factors.   Cardiac Risk Factors include: advanced age (>63mn, >>19women)     Objective:    Today's Vitals   07/17/22 1340  Weight: 113 lb (51.3 kg)  Height: 5' 3.5" (1.613 m)   Body mass index is 19.7 kg/m.     07/17/2022    1:31 PM 07/12/2021   11:43 AM 03/28/2018    5:38 PM  Advanced Directives  Does Patient Have a Medical Advance Directive? Yes Yes No  Type of [place]   Does patient want to make changes to medical advance directive? No - Patient declined    Copy of HGreeleyin Chart? No - copy requested No - copy requested   Would patient like information on creating a medical advance directive?   No - Patient declined    Current Medications (verified) Outpatient Encounter Medications as of 07/17/2022  Medication Sig   alendronate (FOSAMAX) 70 MG tablet TAKE 1 TABLET BY MOUTH EVERY 7 DAYS. TAKE WITH A FULL GLASS OF WATER ON AN EMPTY STOMACH.   Calcium Citrate (CITRACAL PO) Take 1 tablet by mouth daily.   lisinopril (ZESTRIL) 10 MG tablet Take 1 tablet (10 mg total) by mouth daily.   Omega-3 Fatty Acids (FISH OIL) 600 MG CAPS Take 1 tablet by mouth daily.   rosuvastatin (CRESTOR) 5 MG tablet TAKE 1 TABLET (5 MG TOTAL) BY MOUTH DAILY.   triamcinolone (NASACORT) 55 MCG/ACT nasal inhaler Place 2 sprays into the nose daily.   No facility-administered encounter medications on file as of 07/17/2022.    Allergies (verified) Patient has no known allergies.   History: Past Medical History:  Diagnosis Date   Allergy    Colon polyps    Hearing loss, sensorineural 1993   HSV-1 (herpes  simplex virus 1) infection    Hypertension    Osteopenia    Postmenopausal    Past Surgical History:  Procedure Laterality Date   BREAST BIOPSY  1977   Family History  Problem Relation Age of Onset   Heart disease Father        CHF   Diabetes Father    Breast cancer Neg Hx    Social History   Socioeconomic History   Marital status: Married    Spouse name: Not on file   Number of children: Not on file   Years of education: Not on file   Highest education level: Not on file  Occupational History   Not on file  Tobacco Use   Smoking status: Former    Packs/day: 0.50    Types: Cigarettes    Start date: 173   Quit date: 140   Years since quitting: 43.1   Smokeless tobacco: Never  Substance and Sexual Activity   Alcohol use: Yes    Alcohol/week: 0.0 standard drinks of alcohol   Drug use: No   Sexual activity: Not on file  Other Topics Concern   Not on file  Social History Narrative   Not on file   Social Determinants of Health   Financial Resource Strain: Low Risk  (07/17/2022)   Overall Financial Resource Strain (CARDIA)  Difficulty of Paying Living Expenses: Not hard at all  Food Insecurity: No Food Insecurity (07/17/2022)   Hunger Vital Sign    Worried About Running Out of Food in the Last Year: Never true    Ran Out of Food in the Last Year: Never true  Transportation Needs: No Transportation Needs (07/17/2022)   PRAPARE - Hydrologist (Medical): No    Lack of Transportation (Non-Medical): No  Physical Activity: Sufficiently Active (07/17/2022)   Exercise Vital Sign    Days of Exercise per Week: 5 days    Minutes of Exercise per Session: 30 min  Stress: No Stress Concern Present (07/17/2022)   [place]    Feeling of Stress : Not at all  Social Connections: Unknown (07/17/2022)   Social Connection and Isolation Panel [NHANES]    Frequency of Communication with  Friends and Family: More than three times a week    Frequency of Social Gatherings with Friends and Family: More than three times a week    Attends Religious Services: Not on Advertising copywriter or Organizations: Not on file    Attends Archivist Meetings: Not on file    Marital Status: Married    Tobacco Counseling Counseling given: Not Answered   Clinical Intake:  Pre-visit preparation completed: Yes        Diabetes: No  How often do you need to have someone help you when you read instructions, pamphlets, or other written materials from your doctor or pharmacy?: 1 - Never    Interpreter Needed?: No      Activities of Daily Living    07/17/2022    1:35 PM  In your present state of health, do you have any difficulty performing the following activities:  Hearing? 0  Vision? 0  Difficulty concentrating or making decisions? 0  Walking or climbing stairs? 0  Dressing or bathing? 0  Doing errands, shopping? 0  Preparing Food and eating ? N  Using the Toilet? N  In the past six months, have you accidently leaked urine? N  Do you have problems with loss of bowel control? N  Managing your Medications? N  Managing your Finances? N  Housekeeping or managing your Housekeeping? N    Patient Care Team: Einar Pheasant, MD as PCP - General (Internal Medicine)  Indicate any recent Medical Services you may have received from other than [facility] providers in the past year (date may be approximate).     Assessment:   This is a routine wellness examination for Aimee Gomez.  I connected with  Aimee Gomez on 07/17/22 by a audio enabled telemedicine application and verified that I am speaking with the correct person using two identifiers.  Patient Location: Home  Provider Location: Office/Clinic  I discussed the limitations of evaluation and management by telemedicine. The patient expressed understanding and agreed to proceed.   Hearing/Vision  screen Hearing Screening - Comments:: Hearing impaired L ear Does not wear hearing aid   Vision Screening - Comments:: Followed by [facility], Dr. Nilsa Nutting OTC reading glasses They have seen their ophthalmologist in the last 12 months.  Dietary issues and exercise activities discussed: Current Exercise Habits: Home exercise routine, Type of exercise: walking;yoga (1 mile daily.), Time (Minutes): 20, Frequency (Times/Week): 5, Weekly Exercise (Minutes/Week): 100, Intensity: Moderate   Goals Addressed  This Visit's Progress     Patient Stated     Increase physical activity (pt-stated)        Walk more for exercise when weather gets better       Depression Screen    07/17/2022    1:35 PM 07/07/2022    8:26 AM 07/12/2021   11:24 AM 07/05/2021    8:33 AM 12/24/2020    7:40 AM 05/10/2020    1:37 PM 04/14/2019    9:39 AM  PHQ 2/9 Scores  PHQ - 2 Score 0 0 0 0 0 0 0    Fall Risk    07/17/2022    1:33 PM 07/07/2022    8:25 AM 07/12/2021   11:58 AM 07/05/2021    8:33 AM 12/24/2020    7:40 AM  Esmeralda in the past year? 0 0 0 0 0  Number falls in past yr: 0 0  0 0  Injury with Fall? 0 0  0 0  Risk for fall due to :  No Fall Risks     Follow up Falls evaluation completed;Falls prevention discussed Falls evaluation completed Falls evaluation completed Falls evaluation completed Falls evaluation completed    FALL RISK PREVENTION PERTAINING TO THE HOME: Home free of loose throw rugs in walkways, pet beds, electrical cords, etc? Yes  Adequate lighting in your home to reduce risk of falls? Yes   ASSISTIVE DEVICES UTILIZED TO PREVENT FALLS: Life alert? No  Use of a cane, walker or w/c? No  Grab bars in the bathroom? No  Shower chair or bench in shower? No  Elevated toilet seat or a handicapped toilet? No   TIMED UP AND GO: Was the test performed? No .   Cognitive Function:    05/10/2020    2:12 PM  MMSE - Mini Mental State Exam   Orientation to time 5  Orientation to Place 5  Registration 3  Attention/ Calculation 5  Recall 3  Language- name 2 objects 2  Language- repeat 1  Language- follow 3 step command 3  Language- read & follow direction 1  Write a sentence 1  Copy design 1  Total score 30        Immunizations Immunization History  Administered Date(s) Administered   Fluad Quad(high Dose 65+) 02/02/2022   Influenza Split 02/21/2014   Influenza Whole 02/20/2017   Influenza, High Dose Seasonal PF 03/03/2021   Influenza,inj,Quad PF,6+ Mos 02/20/2017, 02/28/2019   Influenza-Unspecified 03/06/2011, 02/22/2016, 02/25/2018   Moderna Covid-19 Vaccine Bivalent Booster 105yr & up 03/29/2021   PFIZER Comirnaty(Gray Top)Covid-19 Tri-Sucrose Vaccine 09/09/2020, 03/07/2022   PFIZER(Purple Top)SARS-COV-2 Vaccination 07/17/2019, 08/07/2019   Pneumococcal Conjugate-13 10/16/2019   Pneumococcal Polysaccharide-23 12/28/2020   Respiratory Syncytial Virus Vaccine,Recomb Aduvanted(Arexvy) 03/22/2022   Td 10/16/2019   Tdap 11/04/2007   Zoster Recombinat (Shingrix) 04/23/2019, 06/25/2019   Screening Tests Health Maintenance  Topic Date Due   COVID-19 Vaccine (6 - 2023-24 season) 07/23/2022 (Originally 05/02/2022)   MAMMOGRAM  05/06/2023   Medicare Annual Wellness (AWV)  07/18/2023   COLONOSCOPY (Pts [phone] coverage will need to be confirmed)  01/08/2024   DTaP/Tdap/Td (3 - Td or Tdap) 10/15/2029   Pneumonia Vaccine 6563Years old  Completed   INFLUENZA VACCINE  Completed   DEXA SCAN  Completed   Hepatitis C Screening  Completed   Zoster Vaccines- Shingrix  Completed   HPV VACCINES  Aged Out   Health Maintenance There are no preventive care reminders to display for  this patient.  Lung Cancer Screening: (Low Dose CT Chest recommended if Age 43-80 years, 30 pack-year currently smoking OR have quit w/in 15years.) does not qualify.   Hepatitis C Screening: Completed 05/2017.  Vision Screening:  Recommended annual ophthalmology exams for early detection of glaucoma and other disorders of the eye.  Dental Screening: Recommended annual dental exams for proper oral hygiene.  Community Resource Referral / Chronic Care Management: CRR required this visit?  No   CCM required this visit?  No      Plan:     I have personally reviewed and noted the following in the patient's chart:   Medical and social history Use of alcohol, tobacco or illicit drugs  Current medications and supplements including opioid prescriptions. Patient is not currently taking opioid prescriptions. Functional ability and status Nutritional status Physical activity Advanced directives List of other physicians Hospitalizations, surgeries, and ER visits in previous 12 months Vitals Screenings to include cognitive, depression, and falls Referrals and appointments  In addition, I have reviewed and discussed with patient certain preventive protocols, quality metrics, and best practice recommendations. A written personalized care plan for preventive services as well as general preventive health recommendations were provided to patient.     Leta Jungling, LPN   [id number]

## 2022-07-17 NOTE — Patient Instructions (Addendum)
[name] , Thank you for taking time to come for your Medicare Wellness Visit. I appreciate your ongoing commitment to your health goals. Please review the following plan we discussed and let me know if I can assist you in the future.   These are the goals we discussed:  Goals       Patient Stated     Increase physical activity (pt-stated)      Walk more for exercise when weather gets better        This is a list of the screening recommended for you and due dates:  Health Maintenance  Topic Date Due   COVID-19 Vaccine (6 - 2023-24 season) 07/23/2022*   Mammogram  05/06/2023   Medicare Annual Wellness Visit  07/18/2023   Colon Cancer Screening  01/08/2024   DTaP/Tdap/Td vaccine (3 - Td or Tdap) 10/15/2029   Pneumonia Vaccine  Completed   Flu Shot  Completed   DEXA scan (bone density measurement)  Completed   Hepatitis C Screening: USPSTF Recommendation to screen - Ages 50-79 yo.  Completed   Zoster (Shingles) Vaccine  Completed   HPV Vaccine  Aged Out  *Topic was postponed. The date shown is not the original due date.    Advanced directives: End of life planning; Advance aging; Advanced directives discussed.  Copy of current HCPOA/Living Will requested.    Conditions/risks identified: none new.  Next appointment: Follow up in one year for your annual wellness visit    Preventive Care 65 Years and Older, Female Preventive care refers to lifestyle choices and visits with your health care provider that can promote health and wellness. What does preventive care include? A yearly physical exam. This is also called an annual well check. Dental exams once or twice a year. Routine eye exams. Ask your health care provider how often you should have your eyes checked. Personal lifestyle choices, including: Daily care of your teeth and gums. Regular physical activity. Eating a healthy diet. Avoiding tobacco and drug use. Limiting alcohol use. Practicing safe sex. Taking low-dose  aspirin every day. Taking vitamin and mineral supplements as recommended by your health care provider. What happens during an annual well check? The services and screenings done by your health care provider during your annual well check will depend on your age, overall health, lifestyle risk factors, and family history of disease. Counseling  Your health care provider may ask you questions about your: Alcohol use. Tobacco use. Drug use. Emotional well-being. Home and relationship well-being. Sexual activity. Eating habits. History of falls. Memory and ability to understand (cognition). Work and work Statistician. Reproductive health. Screening  You may have the following tests or measurements: Height, weight, and BMI. Blood pressure. Lipid and cholesterol levels. These may be checked every 5 years, or more frequently if you are over 28 years old. Skin check. Lung cancer screening. You may have this screening every year starting at age 5 if you have a 30-pack-year history of smoking and currently smoke or have quit within the past 15 years. Fecal occult blood test (FOBT) of the stool. You may have this test every year starting at age 63. Flexible sigmoidoscopy or colonoscopy. You may have a sigmoidoscopy every 5 years or a colonoscopy every 10 years starting at age 75. Hepatitis C blood test. Hepatitis B blood test. Sexually transmitted disease (STD) testing. Diabetes screening. This is done by checking your blood sugar (glucose) after you have not eaten for a while (fasting). You may have this done every  1-3 years. Bone density scan. This is done to screen for osteoporosis. You may have this done starting at age 74. Mammogram. This may be done every 1-2 years. Talk to your health care provider about how often you should have regular mammograms. Talk with your health care provider about your test results, treatment options, and if necessary, the need for more tests. Vaccines  Your  health care provider may recommend certain vaccines, such as: Influenza vaccine. This is recommended every year. Tetanus, diphtheria, and acellular pertussis (Tdap, Td) vaccine. You may need a Td booster every 10 years. Zoster vaccine. You may need this after age 33. Pneumococcal 13-valent conjugate (PCV13) vaccine. One dose is recommended after age 80. Pneumococcal polysaccharide (PPSV23) vaccine. One dose is recommended after age 12. Talk to your health care provider about which screenings and vaccines you need and how often you need them. This information is not intended to replace advice given to you by your health care provider. Make sure you discuss any questions you have with your health care provider. Document Released: 06/18/2015 Document Revised: 02/09/2016 Document Reviewed: 03/23/2015 Elsevier Interactive Patient Education  2017 [place] Prevention in the Home Falls can cause injuries. They can happen to people of all ages. There are many things you can do to make your home safe and to help prevent falls. What can I do on the outside of my home? Regularly fix the edges of walkways and driveways and fix any cracks. Remove anything that might make you trip as you walk through a door, such as a raised step or threshold. Trim any bushes or trees on the path to your home. Use bright outdoor lighting. Clear any walking paths of anything that might make someone trip, such as rocks or tools. Regularly check to see if handrails are loose or broken. Make sure that both sides of any steps have handrails. Any raised decks and porches should have guardrails on the edges. Have any leaves, snow, or ice cleared regularly. Use sand or salt on walking paths during winter. Clean up any spills in your garage right away. This includes oil or grease spills. What can I do in the bathroom? Use night lights. Install grab bars by the toilet and in the tub and shower. Do not use towel bars as  grab bars. Use non-skid mats or decals in the tub or shower. If you need to sit down in the shower, use a plastic, non-slip stool. Keep the floor dry. Clean up any water that spills on the floor as soon as it happens. Remove soap buildup in the tub or shower regularly. Attach bath mats securely with double-sided non-slip rug tape. Do not have throw rugs and other things on the floor that can make you trip. What can I do in the bedroom? Use night lights. Make sure that you have a light by your bed that is easy to reach. Do not use any sheets or blankets that are too big for your bed. They should not hang down onto the floor. Have a firm chair that has side arms. You can use this for support while you get dressed. Do not have throw rugs and other things on the floor that can make you trip. What can I do in the kitchen? Clean up any spills right away. Avoid walking on wet floors. Keep items that you use a lot in easy-to-reach places. If you need to reach something above you, use a strong step stool that has a  grab bar. Keep electrical cords out of the way. Do not use floor polish or wax that makes floors slippery. If you must use wax, use non-skid floor wax. Do not have throw rugs and other things on the floor that can make you trip. What can I do with my stairs? Do not leave any items on the stairs. Make sure that there are handrails on both sides of the stairs and use them. Fix handrails that are broken or loose. Make sure that handrails are as long as the stairways. Check any carpeting to make sure that it is firmly attached to the stairs. Fix any carpet that is loose or worn. Avoid having throw rugs at the top or bottom of the stairs. If you do have throw rugs, attach them to the floor with carpet tape. Make sure that you have a light switch at the top of the stairs and the bottom of the stairs. If you do not have them, ask someone to add them for you. What else can I do to help prevent  falls? Wear shoes that: Do not have high heels. Have rubber bottoms. Are comfortable and fit you well. Are closed at the toe. Do not wear sandals. If you use a stepladder: Make sure that it is fully opened. Do not climb a closed stepladder. Make sure that both sides of the stepladder are locked into place. Ask someone to hold it for you, if possible. Clearly mark and make sure that you can see: Any grab bars or handrails. First and last steps. Where the edge of each step is. Use tools that help you move around (mobility aids) if they are needed. These include: Canes. Walkers. Scooters. Crutches. Turn on the lights when you go into a dark area. Replace any light bulbs as soon as they burn out. Set up your furniture so you have a clear path. Avoid moving your furniture around. If any of your floors are uneven, fix them. If there are any pets around you, be aware of where they are. Review your medicines with your doctor. Some medicines can make you feel dizzy. This can increase your chance of falling. Ask your doctor what other things that you can do to help prevent falls. This information is not intended to replace advice given to you by your health care provider. Make sure you discuss any questions you have with your health care provider. Document Released: 03/18/2009 Document Revised: 10/28/2015 Document Reviewed: 06/26/2014 Elsevier Interactive Patient Education  2017 [employer].

## 2022-08-23 ENCOUNTER — Ambulatory Visit
Admission: RE | Admit: 2022-08-23 | Discharge: 2022-08-23 | Disposition: A | Discharge status: Home / Self Care | Payer: Medicare Other | Source: Ambulatory Visit | Attending: Internal Medicine | Admitting: Internal Medicine

## 2022-08-23 DIAGNOSIS — E2839 Other primary ovarian failure: Secondary | ICD-10-CM | POA: Insufficient documentation

## 2022-08-25 ENCOUNTER — Encounter: Payer: Self-pay | Admitting: Internal Medicine

## 2022-08-28 NOTE — Telephone Encounter (Signed)
Please confirm when she started fosamax - looks like 2021.  If so and if not having any problems with the medication, I would like for her to continue.

## 2022-08-28 NOTE — Telephone Encounter (Signed)
See other note

## 2022-08-29 NOTE — Telephone Encounter (Signed)
Patient is aware. She will continue fosamax.

## 2022-09-24 ENCOUNTER — Other Ambulatory Visit: Payer: Self-pay | Admitting: Internal Medicine

## 2023-01-05 ENCOUNTER — Ambulatory Visit: Payer: Medicare Other | Admitting: Internal Medicine

## 2023-01-05 ENCOUNTER — Encounter: Payer: Self-pay | Admitting: Internal Medicine

## 2023-01-05 VITALS — BP 130/76 | HR 68 | Temp 97.9°F | Resp 16 | Ht 63.5 in | Wt 109.0 lb

## 2023-01-05 DIAGNOSIS — E785 Hyperlipidemia, unspecified: Secondary | ICD-10-CM | POA: Diagnosis not present

## 2023-01-05 DIAGNOSIS — I10 Essential (primary) hypertension: Secondary | ICD-10-CM

## 2023-01-05 DIAGNOSIS — R634 Abnormal weight loss: Secondary | ICD-10-CM

## 2023-01-05 DIAGNOSIS — Z86010 Personal history of colonic polyps: Secondary | ICD-10-CM

## 2023-01-05 DIAGNOSIS — D649 Anemia, unspecified: Secondary | ICD-10-CM

## 2023-01-05 DIAGNOSIS — R739 Hyperglycemia, unspecified: Secondary | ICD-10-CM

## 2023-01-05 DIAGNOSIS — Z1231 Encounter for screening mammogram for malignant neoplasm of breast: Secondary | ICD-10-CM

## 2023-01-05 LAB — BASIC METABOLIC PANEL
BUN: 27 mg/dL — ABNORMAL HIGH (ref 6–23)
CO2: 30 mEq/L (ref 19–32)
Calcium: 9.3 mg/dL (ref 8.4–10.5)
Chloride: 103 mEq/L (ref 96–112)
Creatinine, Ser: 0.85 mg/dL (ref 0.40–1.20)
GFR: 70.43 mL/min (ref >60.00)
Glucose, Bld: 85 mg/dL (ref 70–99)
Potassium: 4.1 mEq/L (ref 3.5–5.1)
Sodium: 139 mEq/L (ref 135–145)

## 2023-01-05 LAB — CBC WITH DIFFERENTIAL/PLATELET
Basophils Absolute: 0 10*3/uL (ref 0.0–0.1)
Basophils Relative: 0.5 % (ref 0.0–3.0)
Eosinophils Absolute: 0.4 10*3/uL (ref 0.0–0.7)
Eosinophils Relative: 6.2 % — ABNORMAL HIGH (ref 0.0–5.0)
HCT: 40.3 % (ref 36.0–46.0)
Hemoglobin: 13 g/dL (ref 12.0–15.0)
Lymphocytes Relative: 29.2 % (ref 12.0–46.0)
Lymphs Abs: 1.7 10*3/uL (ref 0.7–4.0)
MCHC: 32.3 g/dL (ref 30.0–36.0)
MCV: 89.1 fl (ref 78.0–100.0)
Monocytes Absolute: 0.4 10*3/uL (ref 0.1–1.0)
Monocytes Relative: 6.3 % (ref 3.0–12.0)
Neutro Abs: 3.3 10*3/uL (ref 1.4–7.7)
Neutrophils Relative %: 57.8 % (ref 43.0–77.0)
Platelets: 223 10*3/uL (ref 150.0–400.0)
RBC: 4.52 Mil/uL (ref 3.87–5.11)
RDW: 14.2 % (ref 11.5–15.5)
WBC: 5.8 10*3/uL (ref 4.0–10.5)

## 2023-01-05 LAB — HEPATIC FUNCTION PANEL
ALT: 14 U/L (ref 0–35)
AST: 20 U/L (ref 0–37)
Albumin: 4.1 g/dL (ref 3.5–5.2)
Alkaline Phosphatase: 42 U/L (ref 39–117)
Bilirubin, Direct: 0.1 mg/dL (ref 0.0–0.3)
Total Bilirubin: 0.4 mg/dL (ref 0.2–1.2)
Total Protein: 6.7 g/dL (ref 6.0–8.3)

## 2023-01-05 LAB — HEMOGLOBIN A1C: Hgb A1c MFr Bld: 5.9 % (ref 4.6–6.5)

## 2023-01-05 LAB — LIPID PANEL
Cholesterol: 162 mg/dL (ref 0–200)
HDL: 64.4 mg/dL (ref >39.00)
LDL Cholesterol: 88 mg/dL (ref 0–99)
NonHDL: 97.94
Total CHOL/HDL Ratio: 3
Triglycerides: 52 mg/dL (ref 0.0–149.0)
VLDL: 10.4 mg/dL (ref 0.0–40.0)

## 2023-01-05 LAB — TSH: TSH: 2.87 u[IU]/mL (ref 0.35–5.50)

## 2023-01-05 MED ORDER — LISINOPRIL 10 MG PO TABS: 10.0000 mg | ORAL_TABLET | Freq: Every day | ORAL | 2 refills | Status: DC

## 2023-01-05 MED ORDER — ROSUVASTATIN CALCIUM 5 MG PO TABS: 5.0000 mg | ORAL_TABLET | Freq: Every day | ORAL | 1 refills | Status: DC

## 2023-01-05 NOTE — Assessment & Plan Note (Signed)
Follow met b and a1c.  

## 2023-01-05 NOTE — Assessment & Plan Note (Signed)
Follow cbc. Colonoscopy 07/2018.  Recommended f/u in 5 years.

## 2023-01-05 NOTE — Progress Notes (Signed)
Subjective:    [name], female    DOB: [birth year], 67 y.o.   MRN: [id number]  Patient here for  Chief Complaint  Patient presents with   Medical Management of Chronic Issues    HPI Here to follow up regarding hypercholesterolemia, anemia and hypercholesterolemia. She is doing well.  Stays active.  No chest pain or sob reported.  Walks.  Does yoga.  Travels.  No abdominal pain or bowel change. Weight is down several pounds.  She has been eating well.  Appetite is good.  Overall feels good.    Past Medical History:  Diagnosis Date   Allergy    Colon polyps    Hearing loss, sensorineural 1993   HSV-1 (herpes simplex virus 1) infection    Hypertension    Osteopenia    Postmenopausal    Past Surgical History:  Procedure Laterality Date   BREAST BIOPSY  1977   Family History  Problem Relation Age of Onset   Heart disease Father        CHF   Diabetes Father    Breast cancer Neg Hx    Social History   Socioeconomic History   Marital status: Married    Spouse name: Not on file   Number of children: Not on file   Years of education: Not on file   Highest education level: Not on file  Occupational History   Not on file  Tobacco Use   Smoking status: Former    Current packs/day: 0.00    Average packs/day: 0.5 packs/day for 8.0 years (4.0 ttl pk-yrs)    Types: Cigarettes    Start date: 59    Quit date: 52    Years since quitting: 43.6   Smokeless tobacco: Never  Substance and Sexual Activity   Alcohol use: Yes    Alcohol/week: 0.0 standard drinks of alcohol   Drug use: No   Sexual activity: Not on file  Other Topics Concern   Not on file  Social History Narrative   Not on file   Social Determinants of Health   Financial Resource Strain: Low Risk  (07/17/2022)   Overall Financial Resource Strain (CARDIA)    Difficulty of Paying Living Expenses: Not hard at all  Food Insecurity: No Food Insecurity (07/17/2022)   Hunger Vital Sign     Worried About Running Out of Food in the Last Year: Never true    Ran Out of Food in the Last Year: Never true  Transportation Needs: No Transportation Needs (07/17/2022)   PRAPARE - Administrator, Civil Service (Medical): No    Lack of Transportation (Non-Medical): No  Physical Activity: Sufficiently Active (07/17/2022)   Exercise Vital Sign    Days of Exercise per Week: 5 days    Minutes of Exercise per Session: 30 min  Stress: No Stress Concern Present (07/17/2022)   [employer] of Occupational Health - Occupational Stress Questionnaire    Feeling of Stress : Not at all  Social Connections: Unknown (07/17/2022)   Social Connection and Isolation Panel [NHANES]    Frequency of Communication with Friends and Family: More than three times a week    Frequency of Social Gatherings with Friends and Family: More than three times a week    Attends Religious Services: Not on file    Active Member of Clubs or Organizations: Not on file    Attends Banker Meetings: Not on file    Marital Status: Married  Review of Systems  Constitutional:  Negative for appetite change and unexpected weight change.  HENT:  Negative for congestion and sinus pressure.   Respiratory:  Negative for cough, chest tightness and shortness of breath.   Cardiovascular:  Negative for chest pain, palpitations and leg swelling.  Gastrointestinal:  Negative for abdominal pain, diarrhea, nausea and vomiting.  Genitourinary:  Negative for difficulty urinating and dysuria.  Musculoskeletal:  Negative for joint swelling and myalgias.  Skin:  Negative for color change and rash.  Neurological:  Negative for dizziness and headaches.  Psychiatric/Behavioral:  Negative for agitation and dysphoric mood.        Objective:     BP 130/76   Pulse 68   Temp 97.9 F (36.6 C)   Resp 16   Ht 5' 3.5" (1.613 m)   Wt 109 lb (49.4 kg)   LMP 07/26/2003   SpO2 98%   BMI 19.01 kg/m  Wt Readings from  Last 3 Encounters:  01/05/23 109 lb (49.4 kg)  07/17/22 113 lb (51.3 kg)  07/07/22 113 lb 9.6 oz (51.5 kg)    Physical Exam Vitals reviewed.  Constitutional:      General: She is not in acute distress.    Appearance: Normal appearance.  HENT:     Head: Normocephalic and atraumatic.     Right Ear: External ear normal.     Left Ear: External ear normal.  Eyes:     General: No scleral icterus.       Right eye: No discharge.        Left eye: No discharge.     Conjunctiva/sclera: Conjunctivae normal.  Neck:     Thyroid: No thyromegaly.  Cardiovascular:     Rate and Rhythm: Normal rate and regular rhythm.  Pulmonary:     Effort: No respiratory distress.     Breath sounds: Normal breath sounds. No wheezing.  Abdominal:     General: Bowel sounds are normal.     Palpations: Abdomen is soft.     Tenderness: There is no abdominal tenderness.  Musculoskeletal:        General: No swelling or tenderness.     Cervical back: Neck supple. No tenderness.  Lymphadenopathy:     Cervical: No cervical adenopathy.  Skin:    Findings: No erythema or rash.  Neurological:     Mental Status: She is alert.  Psychiatric:        Mood and Affect: Mood normal.        Behavior: Behavior normal.      Outpatient Encounter Medications as of 01/05/2023  Medication Sig   alendronate (FOSAMAX) 70 MG tablet TAKE 1 TABLET BY MOUTH EVERY 7 DAYS. TAKE WITH A FULL GLASS OF WATER ON AN EMPTY STOMACH.   Calcium Citrate (CITRACAL PO) Take 1 tablet by mouth daily.   Omega-3 Fatty Acids (FISH OIL) 600 MG CAPS Take 1 tablet by mouth daily.   triamcinolone (NASACORT) 55 MCG/ACT nasal inhaler Place 2 sprays into the nose daily.   lisinopril (ZESTRIL) 10 MG tablet Take 1 tablet (10 mg total) by mouth daily.   rosuvastatin (CRESTOR) 5 MG tablet Take 1 tablet (5 mg total) by mouth daily.   [DISCONTINUED] lisinopril (ZESTRIL) 10 MG tablet Take 1 tablet (10 mg total) by mouth daily.   [DISCONTINUED] rosuvastatin  (CRESTOR) 5 MG tablet TAKE 1 TABLET (5 MG TOTAL) BY MOUTH DAILY.   No facility-administered encounter medications on file as of 01/05/2023.     Lab Results  Component Value Date   WBC 5.9 01/04/2022   HGB 13.4 01/04/2022   HCT 40.7 01/04/2022   PLT 210.0 01/04/2022   GLUCOSE 81 07/07/2022   CHOL 150 07/07/2022   TRIG 56.0 07/07/2022   HDL 67.70 07/07/2022   LDLDIRECT 113.2 08/07/2012   LDLCALC 71 07/07/2022   ALT 15 07/07/2022   AST 20 07/07/2022   NA 141 07/07/2022   K 4.6 07/07/2022   CL 101 07/07/2022   CREATININE 0.85 07/07/2022   BUN 23 07/07/2022   CO2 31 07/07/2022   TSH 2.37 07/07/2022   HGBA1C 5.9 07/07/2022    DG Bone Density  Result Date: 08/23/2022 EXAM: DUAL X-RAY ABSORPTIOMETRY (DXA) FOR BONE MINERAL DENSITY IMPRESSION: Your patient Jillianna Stanek completed a BMD test on 08/23/2022 using the [employer] iDXA DXA System (software version: 14.10) manufactured by [employer]. The following summarizes the results of our evaluation. Technologist: [facility] PATIENT BIOGRAPHICAL: Name: Lamiyah, Schlotter Patient ID: [id number] Birth Date: 07/12/1955 Height: 63.5 in. Gender: Female Exam Date: 08/23/2022 Weight: 112.4 lbs. Indications: Postmenopausal, Caucasian Fractures: Toes Treatments: Calcium, Fosamax, Vitamin D DENSITOMETRY RESULTS: Site         Region     Measured Date Measured Age WHO Classification Young Adult T-score BMD         %Change vs. Previous Significant Change (*) DualFemur Neck Right 08/23/2021 67.9 Osteopenia -2.3 0.717 g/cm2 -2.0% - DualFemur Neck Right 04/26/2020 65.6 Osteopenia -2.2 0.732 g/cm2 -0.7% - DualFemur Neck Right 04/23/2018 63.6 Osteopenia -2.2 0.737 g/cm2 - - DualFemur Total Mean 08/23/2022 67.9 Osteopenia -1.8 0.782 g/cm2 0.6% - DualFemur Total Mean 04/26/2020 65.6 Osteopenia -1.8 0.777 g/cm2 -0.6% - DualFemur Total Mean 04/23/2018 63.6 Osteopenia -1.8 0.782 g/cm2 - - Left Forearm Radius 33% 08/23/2022 67.9 Osteopenia -2.2 0.685 g/cm2 - - ASSESSMENT: The  BMD measured at Femur Neck Right is 0.717 g/cm2 with a T-score of -2.3. This patient is considered osteopenic according to World Health Organization [facility]) criteria. The scan quality is good. Lumbar spine was not utilized due to advanced degenerative changes. Compared with prior study, there has been no significant change in the total hip. Patient is not a candidate for FRAX due to Fosamax. World Science writer [facility]) criteria for post-menopausal, Caucasian Women: Normal:                   T-score at or above -1 SD Osteopenia/low bone mass: T-score between -1 and -2.5 SD Osteoporosis:             T-score at or below -2.5 SD RECOMMENDATIONS: 1. All patients should optimize calcium and vitamin D intake. 2. Consider FDA-approved medical therapies in postmenopausal women and men aged 3 years and older, based on the following: a. A hip or vertebral(clinical or morphometric) fracture b. T-score < -2.5 at the femoral neck or spine after appropriate evaluation to exclude secondary causes c. Low bone mass (T-score between -1.0 and -2.5 at the femoral neck or spine) and a 10-year probability of a hip fracture > 3% or a 10-year probability of a major osteoporosis-related fracture > 20% based on the [place]-adapted WHO algorithm 3. Clinician judgment and/or patient preferences may indicate treatment for people with 10-year fracture probabilities above or below these levels FOLLOW-UP: People with diagnosed cases of osteoporosis or at high risk for fracture should have regular bone mineral density tests. For patients eligible for Medicare, routine testing is allowed once every 2 years. The testing frequency can be increased to one year for patients  who have rapidly progressing disease, those who are receiving or discontinuing medical therapy to restore bone mass, or have additional risk factors. I have reviewed this report, and agree with the above findings. Mark A. Tyron Russell, M.D. [facility] Electronically Signed    By: Ulyses Southward M.D.   On: 08/23/2022 09:14       Assessment & Plan:  Hyperlipidemia, unspecified hyperlipidemia type -     Hepatic function panel -     Lipid panel  Hyperglycemia Assessment & Plan: Follow met b and a1c.   Orders: -     Hemoglobin A1c  Anemia, unspecified type Assessment & Plan: Follow cbc. Colonoscopy 07/2018.  Recommended f/u in 5 years.    Orders: -     CBC with Differential/Platelet -     Basic metabolic panel  Visit for screening mammogram -     3D Screening Mammogram, Left and Right; Future  Weight loss Assessment & Plan: Weight down several pounds from last check.  Eating well.  No nausea or vomiting.  No abdominal pain.  No bowel change.  Good appetite.  Will follow.  Notify me if persistent weight loss or if symptoms change. Check labs, including cbc and tsh.   Orders: -     TSH  History of colonic polyps Assessment & Plan: Colonoscopy 07/2018.  Reports no polyps.  Recommended f/u in 5 years.     Hypertension, essential Assessment & Plan: On lisinopril.  Blood pressure as outlined.  Follow pressures.  Follow metabolic panel.  Have her spot check her pressure.    Other orders -     Lisinopril; Take 1 tablet (10 mg total) by mouth daily.  Dispense: 90 tablet; Refill: 2 -     Rosuvastatin Calcium; Take 1 tablet (5 mg total) by mouth daily.  Dispense: 90 tablet; Refill: 1     Dale Twin Brooks, MD

## 2023-01-05 NOTE — Assessment & Plan Note (Addendum)
On lisinopril.  Blood pressure as outlined.  Follow pressures.  Follow metabolic panel.  Have her spot check her pressure.

## 2023-01-05 NOTE — Assessment & Plan Note (Signed)
Weight down several pounds from last check.  Eating well.  No nausea or vomiting.  No abdominal pain.  No bowel change.  Good appetite.  Will follow.  Notify me if persistent weight loss or if symptoms change. Check labs, including cbc and tsh.

## 2023-01-05 NOTE — Assessment & Plan Note (Signed)
Colonoscopy 07/2018.  Reports no polyps.  Recommended f/u in 5 years.   

## 2023-05-08 ENCOUNTER — Ambulatory Visit
Admission: RE | Admit: 2023-05-08 | Discharge: 2023-05-08 | Disposition: A | Discharge status: Home / Self Care | Payer: Medicare Other | Source: Ambulatory Visit | Attending: Internal Medicine | Admitting: Internal Medicine

## 2023-05-08 DIAGNOSIS — Z1231 Encounter for screening mammogram for malignant neoplasm of breast: Secondary | ICD-10-CM | POA: Insufficient documentation

## 2023-07-10 ENCOUNTER — Ambulatory Visit (INDEPENDENT_AMBULATORY_CARE_PROVIDER_SITE_OTHER): Payer: Medicare Other | Admitting: Internal Medicine

## 2023-07-10 ENCOUNTER — Encounter: Payer: Self-pay | Admitting: Internal Medicine

## 2023-07-10 VITALS — BP 122/70 | HR 74 | Temp 98.0°F | Resp 16 | Ht 63.0 in | Wt 113.0 lb

## 2023-07-10 DIAGNOSIS — I10 Essential (primary) hypertension: Secondary | ICD-10-CM

## 2023-07-10 DIAGNOSIS — M8580 Other specified disorders of bone density and structure, unspecified site: Secondary | ICD-10-CM

## 2023-07-10 DIAGNOSIS — Z0000 Encounter for general adult medical examination without abnormal findings: Secondary | ICD-10-CM

## 2023-07-10 DIAGNOSIS — R739 Hyperglycemia, unspecified: Secondary | ICD-10-CM

## 2023-07-10 DIAGNOSIS — Z860100 Personal history of colon polyps, unspecified: Secondary | ICD-10-CM

## 2023-07-10 LAB — BASIC METABOLIC PANEL
BUN: 24 mg/dL — ABNORMAL HIGH (ref 6–23)
CO2: 30 meq/L (ref 19–32)
Calcium: 9 mg/dL (ref 8.4–10.5)
Chloride: 102 meq/L (ref 96–112)
Creatinine, Ser: 0.88 mg/dL (ref 0.40–1.20)
GFR: 67.32 mL/min (ref >60.00)
Glucose, Bld: 75 mg/dL (ref 70–99)
Potassium: 3.8 meq/L (ref 3.5–5.1)
Sodium: 139 meq/L (ref 135–145)

## 2023-07-10 LAB — HEPATIC FUNCTION PANEL
ALT: 17 U/L (ref 0–35)
AST: 20 U/L (ref 0–37)
Albumin: 4.1 g/dL (ref 3.5–5.2)
Alkaline Phosphatase: 44 U/L (ref 39–117)
Bilirubin, Direct: 0.1 mg/dL (ref 0.0–0.3)
Total Bilirubin: 0.4 mg/dL (ref 0.2–1.2)
Total Protein: 6.7 g/dL (ref 6.0–8.3)

## 2023-07-10 LAB — LIPID PANEL
Cholesterol: 158 mg/dL (ref 0–200)
HDL: 71.5 mg/dL (ref >39.00)
LDL Cholesterol: 77 mg/dL (ref 0–99)
NonHDL: 86.02
Total CHOL/HDL Ratio: 2
Triglycerides: 47 mg/dL (ref 0.0–149.0)
VLDL: 9.4 mg/dL (ref 0.0–40.0)

## 2023-07-10 LAB — HEMOGLOBIN A1C: Hgb A1c MFr Bld: 6 % (ref 4.6–6.5)

## 2023-07-10 MED ORDER — ALENDRONATE SODIUM 70 MG PO TABS: ORAL_TABLET | ORAL | 3 refills | Status: DC

## 2023-07-10 MED ORDER — LISINOPRIL 10 MG PO TABS: 10.0000 mg | ORAL_TABLET | Freq: Every day | ORAL | 3 refills | Status: AC

## 2023-07-10 MED ORDER — ROSUVASTATIN CALCIUM 5 MG PO TABS: 5.0000 mg | ORAL_TABLET | Freq: Every day | ORAL | 3 refills | Status: DC

## 2023-07-10 NOTE — Assessment & Plan Note (Addendum)
Physical today 07/10/23.  PAP 04/14/19 - negative with negative HPV.  Mammogram 05/08/23 - Birads I.  Colonoscopy 07/2018 - recommended f/u in 5 years.  Discussed f/u colonoscopy.

## 2023-07-10 NOTE — Assessment & Plan Note (Signed)
Colonoscopy 07/2018.  Reports no polyps.  Recommended f/u in 5 years.   

## 2023-07-10 NOTE — Assessment & Plan Note (Signed)
 Follow met b and a1c.

## 2023-07-10 NOTE — Progress Notes (Signed)
 Subjective:    [name], female    DOB: [birth year], 68 y.o.   MRN: [id number]  Patient here for  Chief Complaint  Patient presents with   Annual Exam    HPI With past history of hypercholesterolemia and anemia, she comes in today to follow up on these issues as well as for a complete physical exam. She is doing well. Stays active. Exercises. Yoga. No chest pain or sob reported. No abdominal pain or bowel change reported.    Past Medical History:  Diagnosis Date   Allergy    Colon polyps    Hearing loss, sensorineural 1993   HSV-1 (herpes simplex virus 1) infection    Hypertension    Osteopenia    Postmenopausal    Past Surgical History:  Procedure Laterality Date   BREAST BIOPSY  1977   Family History  Problem Relation Age of Onset   Heart disease Father        CHF   Diabetes Father    Breast cancer Neg Hx    Social History   Socioeconomic History   Marital status: Married    Spouse name: Not on file   Number of children: Not on file   Years of education: Not on file   Highest education level: Not on file  Occupational History   Not on file  Tobacco Use   Smoking status: Former    Current packs/day: 0.00    Average packs/day: 0.5 packs/day for 8.0 years (4.0 ttl pk-yrs)    Types: Cigarettes    Start date: 18    Quit date: 33    Years since quitting: 44.1   Smokeless tobacco: Never  Substance and Sexual Activity   Alcohol use: Yes    Alcohol/week: 0.0 standard drinks of alcohol   Drug use: No   Sexual activity: Not on file  Other Topics Concern   Not on file  Social History Narrative   Not on file   Social Drivers of Health   Financial Resource Strain: Low Risk  (07/17/2022)   Overall Financial Resource Strain (CARDIA)    Difficulty of Paying Living Expenses: Not hard at all  Food Insecurity: No Food Insecurity (07/17/2022)   Hunger Vital Sign    Worried About Running Out of Food in the Last Year: Never true    Ran Out of  Food in the Last Year: Never true  Transportation Needs: No Transportation Needs (07/17/2022)   PRAPARE - Administrator, Civil Service (Medical): No    Lack of Transportation (Non-Medical): No  Physical Activity: Sufficiently Active (07/17/2022)   Exercise Vital Sign    Days of Exercise per Week: 5 days    Minutes of Exercise per Session: 30 min  Stress: No Stress Concern Present (07/17/2022)   [employer] of Occupational Health - Occupational Stress Questionnaire    Feeling of Stress : Not at all  Social Connections: Unknown (07/17/2022)   Social Connection and Isolation Panel [NHANES]    Frequency of Communication with Friends and Family: More than three times a week    Frequency of Social Gatherings with Friends and Family: More than three times a week    Attends Religious Services: Not on Marketing Executive or Organizations: Not on file    Attends Banker Meetings: Not on file    Marital Status: Married     Review of Systems  Constitutional:  Negative for appetite change  and unexpected weight change.  HENT:  Negative for congestion, sinus pressure and sore throat.   Eyes:  Negative for pain and visual disturbance.  Respiratory:  Negative for cough, chest tightness and shortness of breath.   Cardiovascular:  Negative for chest pain, palpitations and leg swelling.  Gastrointestinal:  Negative for abdominal pain, diarrhea, nausea and vomiting.  Genitourinary:  Negative for difficulty urinating and dysuria.  Musculoskeletal:  Negative for joint swelling and myalgias.  Skin:  Negative for color change and rash.  Neurological:  Negative for dizziness and headaches.  Hematological:  Negative for adenopathy. Does not bruise/bleed easily.  Psychiatric/Behavioral:  Negative for agitation and dysphoric mood.        Objective:     BP 122/70   Pulse 74   Temp 98 F (36.7 C)   Resp 16   Ht 5' 3 (1.6 m)   Wt 113 lb (51.3 kg)   LMP  07/26/2003   SpO2 98%   BMI 20.02 kg/m  Wt Readings from Last 3 Encounters:  07/10/23 113 lb (51.3 kg)  01/05/23 109 lb (49.4 kg)  07/17/22 113 lb (51.3 kg)    Physical Exam Vitals reviewed.  Constitutional:      General: She is not in acute distress.    Appearance: Normal appearance. She is well-developed.  HENT:     Head: Normocephalic and atraumatic.     Right Ear: External ear normal.     Left Ear: External ear normal.     Mouth/Throat:     Pharynx: No oropharyngeal exudate or posterior oropharyngeal erythema.  Eyes:     General: No scleral icterus.       Right eye: No discharge.        Left eye: No discharge.     Conjunctiva/sclera: Conjunctivae normal.  Neck:     Thyroid : No thyromegaly.  Cardiovascular:     Rate and Rhythm: Normal rate and regular rhythm.  Pulmonary:     Effort: No tachypnea, accessory muscle usage or respiratory distress.     Breath sounds: Normal breath sounds. No decreased breath sounds or wheezing.  Chest:  Breasts:    Right: No inverted nipple, mass, nipple discharge or tenderness (no axillary adenopathy).     Left: No inverted nipple, mass, nipple discharge or tenderness (no axilarry adenopathy).  Abdominal:     General: Bowel sounds are normal.     Palpations: Abdomen is soft.     Tenderness: There is no abdominal tenderness.  Musculoskeletal:        General: No swelling or tenderness.     Cervical back: Neck supple.  Lymphadenopathy:     Cervical: No cervical adenopathy.  Skin:    Findings: No erythema or rash.  Neurological:     Mental Status: She is alert and oriented to person, place, and time.  Psychiatric:        Mood and Affect: Mood normal.        Behavior: Behavior normal.         Outpatient Encounter Medications as of 07/10/2023  Medication Sig   alendronate  (FOSAMAX ) 70 MG tablet TAKE 1 TABLET BY MOUTH EVERY 7 DAYS. TAKE WITH A FULL GLASS OF WATER ON AN EMPTY STOMACH.   Calcium  Citrate (CITRACAL PO) Take 1 tablet by  mouth daily.   lisinopril  (ZESTRIL ) 10 MG tablet Take 1 tablet (10 mg total) by mouth daily.   Omega-3 Fatty Acids (FISH OIL) 600 MG CAPS Take 1 tablet by mouth daily.  rosuvastatin  (CRESTOR ) 5 MG tablet Take 1 tablet (5 mg total) by mouth daily.   triamcinolone (NASACORT) 55 MCG/ACT nasal inhaler Place 2 sprays into the nose daily.   [DISCONTINUED] alendronate  (FOSAMAX ) 70 MG tablet TAKE 1 TABLET BY MOUTH EVERY 7 DAYS. TAKE WITH A FULL GLASS OF WATER ON AN EMPTY STOMACH.   [DISCONTINUED] lisinopril  (ZESTRIL ) 10 MG tablet Take 1 tablet (10 mg total) by mouth daily.   [DISCONTINUED] rosuvastatin  (CRESTOR ) 5 MG tablet Take 1 tablet (5 mg total) by mouth daily.   No facility-administered encounter medications on file as of 07/10/2023.     Lab Results  Component Value Date   WBC 5.8 01/05/2023   HGB 13.0 01/05/2023   HCT 40.3 01/05/2023   PLT 223.0 01/05/2023   GLUCOSE 85 01/05/2023   CHOL 162 01/05/2023   TRIG 52.0 01/05/2023   HDL 64.40 01/05/2023   LDLDIRECT 113.2 08/07/2012   LDLCALC 88 01/05/2023   ALT 14 01/05/2023   AST 20 01/05/2023   NA 139 01/05/2023   K 4.1 01/05/2023   CL 103 01/05/2023   CREATININE 0.85 01/05/2023   BUN 27 (H) 01/05/2023   CO2 30 01/05/2023   TSH 2.87 01/05/2023   HGBA1C 5.9 01/05/2023    MM 3D SCREENING MAMMOGRAM BILATERAL BREAST Result Date: 05/09/2023 CLINICAL DATA:  Screening. EXAM: DIGITAL SCREENING BILATERAL MAMMOGRAM WITH TOMOSYNTHESIS AND CAD TECHNIQUE: Bilateral screening digital craniocaudal and mediolateral oblique mammograms were obtained. Bilateral screening digital breast tomosynthesis was performed. The images were evaluated with computer-aided detection. COMPARISON:  Previous exam(s). ACR Breast Density Category b: There are scattered areas of fibroglandular density. FINDINGS: There are no findings suspicious for malignancy. IMPRESSION: No mammographic evidence of malignancy. A result letter of this screening mammogram will be mailed  directly to the patient. RECOMMENDATION: Screening mammogram in one year. (Code:SM-B-01Y) BI-RADS CATEGORY  1: Negative. Electronically Signed   By: Dina  Arceo M.D.   On: 05/09/2023 16:32       Assessment & Plan:  Routine general medical examination at a health care facility  Health care maintenance Assessment & Plan: Physical today 07/10/23.  PAP 04/14/19 - negative with negative HPV.  Mammogram 05/08/23 - Birads I.  Colonoscopy 07/2018 - recommended f/u in 5 years.  Discussed f/u colonoscopy.    Hyperglycemia Assessment & Plan: Follow met b and a1c.   Orders: -     Hemoglobin A1c  Hypertension, essential Assessment & Plan: On lisinopril .  Blood pressure as outlined.  Follow pressures.  Follow metabolic panel.   Orders: -     Basic metabolic panel -     Hepatic function panel -     Lipid panel  Osteopenia, unspecified location Assessment & Plan: On fosamax .  F/u bone density 08/2022.  Continue calcium , vitamin D  and weight bearing exercise.     History of colonic polyps Assessment & Plan: Colonoscopy 07/2018.  Reports no polyps.  Recommended f/u in 5 years.     Other orders -     Alendronate  Sodium; TAKE 1 TABLET BY MOUTH EVERY 7 DAYS. TAKE WITH A FULL GLASS OF WATER ON AN EMPTY STOMACH.  Dispense: 12 tablet; Refill: 3 -     Lisinopril ; Take 1 tablet (10 mg total) by mouth daily.  Dispense: 90 tablet; Refill: 3 -     Rosuvastatin  Calcium ; Take 1 tablet (5 mg total) by mouth daily.  Dispense: 90 tablet; Refill: 3     Allena Hamilton, MD

## 2023-07-10 NOTE — Assessment & Plan Note (Signed)
On fosamax.  F/u bone density 08/2022.  Continue calcium, vitamin D and weight bearing exercise.

## 2023-07-10 NOTE — Assessment & Plan Note (Signed)
On lisinopril.  Blood pressure as outlined.  Follow pressures.  Follow metabolic panel.  

## 2023-08-27 ENCOUNTER — Ambulatory Visit (INDEPENDENT_AMBULATORY_CARE_PROVIDER_SITE_OTHER): Payer: Medicare Other

## 2023-08-27 VITALS — BP 122/70 | Ht 65.0 in | Wt 113.0 lb

## 2023-08-27 DIAGNOSIS — Z0000 Encounter for general adult medical examination without abnormal findings: Secondary | ICD-10-CM | POA: Diagnosis not present

## 2023-08-27 NOTE — Progress Notes (Signed)
 Subjective:   Aimee Gomez is a 68 y.o. who presents for a Medicare Wellness preventive visit.  Visit Complete: Virtual I connected with  Aimee Gomez on 08/27/23 by a audio enabled telemedicine application and verified that I am speaking with the correct person using two identifiers.  Patient Location: Home  Provider Location: Home Office  I discussed the limitations of evaluation and management by telemedicine. The patient expressed understanding and agreed to proceed.  Vital Signs: Because this visit was a virtual/telehealth visit, some criteria may be missing or patient reported. Any vitals not documented were not able to be obtained and vitals that have been documented are patient reported.  VideoDeclined- This patient declined Librarian, academic. Therefore the visit was completed with audio only.  Persons Participating in Visit: Patient.  AWV Questionnaire: No: Patient Medicare AWV questionnaire was not completed prior to this visit.  Cardiac Risk Factors include: advanced age (>21men, >106 women)     Objective:    Today's Vitals   08/27/23 1054  BP: 122/70  Weight: 113 lb (51.3 kg)  Height: 5\' 5"  (1.651 m)   Body mass index is 18.8 kg/m.     08/27/2023   11:03 AM 07/17/2022    1:31 PM 07/12/2021   11:43 AM 03/28/2018    5:38 PM  Advanced Directives  Does Patient Have a Medical Advance Directive? Yes Yes Yes No  Type of Estate agent of [place];Living will Healthcare Power of [place];Living will Healthcare Power of [place];Living will   Does patient want to make changes to medical advance directive?  No - Patient declined    Copy of Healthcare Power of Attorney in Chart? Yes - validated most recent copy scanned in chart (See row information) No - copy requested No - copy requested   Would patient like information on creating a medical advance directive?    No - Patient declined    Current Medications  (verified) Outpatient Encounter Medications as of 08/27/2023  Medication Sig   alendronate (FOSAMAX) 70 MG tablet TAKE 1 TABLET BY MOUTH EVERY 7 DAYS. TAKE WITH A FULL GLASS OF WATER ON AN EMPTY STOMACH.   Calcium Citrate (CITRACAL PO) Take 1 tablet by mouth daily.   lisinopril (ZESTRIL) 10 MG tablet Take 1 tablet (10 mg total) by mouth daily.   Omega-3 Fatty Acids (FISH OIL) 600 MG CAPS Take 1 tablet by mouth daily.   rosuvastatin (CRESTOR) 5 MG tablet Take 1 tablet (5 mg total) by mouth daily.   triamcinolone (NASACORT) 55 MCG/ACT nasal inhaler Place 2 sprays into the nose daily.   No facility-administered encounter medications on file as of 08/27/2023.    Allergies (verified) Patient has no known allergies.   History: Past Medical History:  Diagnosis Date   Allergy    Colon polyps    Hearing loss, sensorineural 1993   HSV-1 (herpes simplex virus 1) infection    Hypertension    Osteopenia    Postmenopausal    Past Surgical History:  Procedure Laterality Date   BREAST BIOPSY  1977   Family History  Problem Relation Age of Onset   Heart disease Father        CHF   Diabetes Father    Breast cancer Neg Hx    Social History   Socioeconomic History   Marital status: Married    Spouse name: Not on file   Number of children: Not on file   Years of education: Not on file  Highest education level: Master's degree (e.g., MA, MS, MEng, MEd, MSW, MBA)  Occupational History   Not on file  Tobacco Use   Smoking status: Former    Current packs/day: 0.00    Average packs/day: 0.5 packs/day for 8.0 years (4.0 ttl pk-yrs)    Types: Cigarettes    Start date: 22    Quit date: 75    Years since quitting: 44.2   Smokeless tobacco: Never  Substance and Sexual Activity   Alcohol use: Yes    Alcohol/week: 0.0 standard drinks of alcohol   Drug use: No   Sexual activity: Not on file  Other Topics Concern   Not on file  Social History Narrative   Not on file   Social  Drivers of Health   Financial Resource Strain: Low Risk  (08/27/2023)   Overall Financial Resource Strain (CARDIA)    Difficulty of Paying Living Expenses: Not hard at all  Food Insecurity: No Food Insecurity (08/23/2023)   Hunger Vital Sign    Worried About Running Out of Food in the Last Year: Never true    Ran Out of Food in the Last Year: Never true  Transportation Needs: No Transportation Needs (08/23/2023)   PRAPARE - Administrator, Civil Service (Medical): No    Lack of Transportation (Non-Medical): No  Physical Activity: Sufficiently Active (08/27/2023)   Exercise Vital Sign    Days of Exercise per Week: 5 days    Minutes of Exercise per Session: 40 min  Stress: No Stress Concern Present (08/27/2023)   [employer] of Occupational Health - Occupational Stress Questionnaire    Feeling of Stress : Not at all  Social Connections: Socially Integrated (08/23/2023)   Social Connection and Isolation Panel [NHANES]    Frequency of Communication with Friends and Family: Twice a week    Frequency of Social Gatherings with Friends and Family: Once a week    Attends Religious Services: 1 to 4 times per year    Active Member of [employer] or Organizations: No    Attends Engineer, structural: More than 4 times per year    Marital Status: Married    Tobacco Counseling Counseling given: Yes    Clinical Intake:  Pre-visit preparation completed: Yes  Pain : No/denies pain     BMI - recorded: 18.8 Nutritional Risks: None Diabetes: No  Lab Results  Component Value Date   HGBA1C 6.0 07/10/2023   HGBA1C 5.9 01/05/2023   HGBA1C 5.9 07/07/2022     How often do you need to have someone help you when you read instructions, pamphlets, or other written materials from your doctor or pharmacy?: 1 - Never  Interpreter Needed?: No  Information entered by :: Alia T./CMA   Activities of Daily Living     08/27/2023   11:22 AM 08/23/2023   10:57 AM  In your present  state of health, do you have any difficulty performing the following activities:  Hearing? 0 0  Vision? 0 0  Difficulty concentrating or making decisions? 0 0  Walking or climbing stairs? 0 0  Dressing or bathing? 0 0  Doing errands, shopping? 0 0  Preparing Food and eating ? N N  Using the Toilet? N N  In the past six months, have you accidently leaked urine? N N  Do you have problems with loss of bowel control? N N  Managing your Medications? N N  Managing your Finances? N N  Housekeeping or managing your Housekeeping? N  N    Patient Care Team: Dale [place], MD as PCP - General (Internal Medicine)  Indicate any recent Medical Services you may have received from other than [facility] providers in the past year (date may be approximate).     Assessment:   This is a routine wellness examination for Aimee Gomez.  Hearing/Vision screen Hearing Screening - Comments:: Pt have a little hearing loss in the L-ear, but its chronic Vision Screening - Comments:: Pt denies vision def  Pt goes to [facility], Dr. Nance Pear   Goals Addressed               This Visit's Progress     Increase physical activity (pt-stated)   On track     Walk more for exercise when weather gets better      Patient Stated        Do yoga 2-3x's /week        Depression Screen     08/27/2023   10:58 AM 07/10/2023    8:28 AM 07/17/2022    1:35 PM 07/07/2022    8:26 AM 07/12/2021   11:24 AM 07/05/2021    8:33 AM 12/24/2020    7:40 AM  PHQ 2/9 Scores  PHQ - 2 Score 0 0 0 0 0 0 0  PHQ- 9 Score 0          Fall Risk     08/27/2023   11:16 AM 08/23/2023   10:57 AM 07/16/2023   10:49 AM 07/10/2023    8:27 AM 07/17/2022    1:33 PM  Fall Risk   Falls in the past year? 0 0 0 0 0  Number falls in past yr: 0 0  0 0  Injury with Fall? 0 0 0 0 0  Risk for fall due to : No Fall Risks No Fall Risks  No Fall Risks   Follow up Falls prevention discussed;Falls evaluation completed Falls prevention discussed;Falls  evaluation completed  Falls evaluation completed Falls evaluation completed;Falls prevention discussed    MEDICARE RISK AT HOME:  Medicare Risk at Home Any stairs in or around the home?: Yes If so, are there any without handrails?: No Home free of loose throw rugs in walkways, pet beds, electrical cords, etc?: Yes Adequate lighting in your home to reduce risk of falls?: Yes Life alert?: No Use of a cane, walker or w/c?: No Grab bars in the bathroom?: No Shower chair or bench in shower?: No Elevated toilet seat or a handicapped toilet?: No  TIMED UP AND GO:  Was the test performed?  No  Cognitive Function: 6CIT completed    05/10/2020    2:12 PM  MMSE - Mini Mental State Exam  Orientation to time 5  Orientation to Place 5  Registration 3  Attention/ Calculation 5  Recall 3  Language- name 2 objects 2  Language- repeat 1  Language- follow 3 step command 3  Language- read & follow direction 1  Write a sentence 1  Copy design 1  Total score 30        08/27/2023   11:07 AM  6CIT Screen  What Year? 0 points  What month? 0 points  What time? 0 points  Count back from 20 0 points  Months in reverse 0 points  Repeat phrase 0 points  Total Score 0 points    Immunizations Immunization History  Administered Date(s) Administered   Fluad Quad(high Dose 65+) 02/02/2022   Influenza Split 02/21/2014   Influenza Whole 02/20/2017  Influenza, High Dose Seasonal PF 03/03/2021, 02/02/2023   Influenza,inj,Quad PF,6+ Mos 02/20/2017, 02/28/2019   Influenza-Unspecified 03/06/2011, 02/22/2016, 02/25/2018   Moderna Covid-19 Fall Seasonal Vaccine 60yrs & older 02/02/2023   Moderna Covid-19 Vaccine Bivalent Booster 63yrs & up 03/29/2021   PFIZER Comirnaty(Gray Top)Covid-19 Tri-Sucrose Vaccine 09/09/2020, 03/07/2022   PFIZER(Purple Top)SARS-COV-2 Vaccination 07/17/2019, 08/07/2019   Pneumococcal Conjugate-13 10/16/2019   Pneumococcal Polysaccharide-23 12/28/2020   Respiratory  Syncytial Virus Vaccine,Recomb Aduvanted(Arexvy) 03/22/2022   Td 10/16/2019   Tdap 11/04/2007   Zoster Recombinant(Shingrix) 04/23/2019, 06/25/2019    Screening Tests Health Maintenance  Topic Date Due   COVID-19 Vaccine (7 - 2024-25 season) 09/12/2023 (Originally 08/03/2023)   Colonoscopy  01/08/2024   MAMMOGRAM  05/07/2024   Medicare Annual Wellness (AWV)  08/26/2024   DTaP/Tdap/Td (3 - Td or Tdap) 10/15/2029   Pneumonia Vaccine 32+ Years old  Completed   INFLUENZA VACCINE  Completed   DEXA SCAN  Completed   Hepatitis C Screening  Completed   Zoster Vaccines- Shingrix  Completed   HPV VACCINES  Aged Out    Health Maintenance  There are no preventive care reminders to display for this patient.  Health Maintenance Items Addressed: See Nurse Notes  Additional Screening:  Vision Screening: Recommended annual ophthalmology exams for early detection of glaucoma and other disorders of the eye.  Dental Screening: Recommended annual dental exams for proper oral hygiene  Community Resource Referral / Chronic Care Management: CRR required this visit?  No   CCM required this visit?  No     Plan:     I have personally reviewed and noted the following in the patient's chart:   Medical and social history Use of alcohol, tobacco or illicit drugs  Current medications and supplements including opioid prescriptions. Patient is not currently taking opioid prescriptions. Functional ability and status Nutritional status Physical activity Advanced directives List of other physicians Hospitalizations, surgeries, and ER visits in previous 12 months Vitals Screenings to include cognitive, depression, and falls Referrals and appointments  In addition, I have reviewed and discussed with patient certain preventive protocols, quality metrics, and best practice recommendations. A written personalized care plan for preventive services as well as general preventive health recommendations  were provided to patient.     Arta Silence, CMA   08/27/2023   After Visit Summary: (MyChart) Due to this being a telephonic visit, the after visit summary with patients personalized plan was offered to patient via MyChart   Notes: Nothing significant to report at this time.

## 2023-08-27 NOTE — Patient Instructions (Signed)
 Aimee Gomez , Thank you for taking time to come for your Medicare Wellness Visit. I appreciate your ongoing commitment to your health goals. Please review the following plan we discussed and let me know if I can assist you in the future.   Referrals/Orders/Follow-Ups/Clinician Recommendations: Keep up the good work on keeping health up to date.   This is a list of the screening recommended for you and due dates:  Health Maintenance  Topic Date Due   COVID-19 Vaccine (7 - 2024-25 season) 09/12/2023*   Colon Cancer Screening  01/08/2024   Mammogram  05/07/2024   Medicare Annual Wellness Visit  08/26/2024   DTaP/Tdap/Td vaccine (3 - Td or Tdap) 10/15/2029   Pneumonia Vaccine  Completed   Flu Shot  Completed   DEXA scan (bone density measurement)  Completed   Hepatitis C Screening  Completed   Zoster (Shingles) Vaccine  Completed   HPV Vaccine  Aged Out  *Topic was postponed. The date shown is not the original due date.    Advanced directives: (In Chart) A copy of your advanced directives are scanned into your chart should your provider ever need it.  Next Medicare Annual Wellness Visit scheduled for next year: Yes

## 2023-10-03 ENCOUNTER — Ambulatory Visit: Payer: Self-pay

## 2023-10-03 DIAGNOSIS — Z09 Encounter for follow-up examination after completed treatment for conditions other than malignant neoplasm: Secondary | ICD-10-CM | POA: Diagnosis not present

## 2023-10-03 DIAGNOSIS — Z860101 Personal history of adenomatous and serrated colon polyps: Secondary | ICD-10-CM | POA: Diagnosis not present

## 2023-10-03 DIAGNOSIS — K5730 Diverticulosis of large intestine without perforation or abscess without bleeding: Secondary | ICD-10-CM | POA: Diagnosis not present

## 2023-10-03 DIAGNOSIS — K640 First degree hemorrhoids: Secondary | ICD-10-CM | POA: Diagnosis not present

## 2023-10-19 IMAGING — MG MM DIGITAL SCREENING BILAT W/ TOMO AND CAD
8 series · 9 of 24 positions shown · non-contrast
Comparison: Previous exam(s).

CLINICAL DATA: Screening.

EXAM:
DIGITAL SCREENING BILATERAL MAMMOGRAM WITH TOMOSYNTHESIS AND CAD
TECHNIQUE: Bilateral screening digital craniocaudal and mediolateral oblique
mammograms were obtained. Bilateral screening digital breast
tomosynthesis was performed. The images were evaluated with
computer-aided detection.

[R MLO synth-2D]
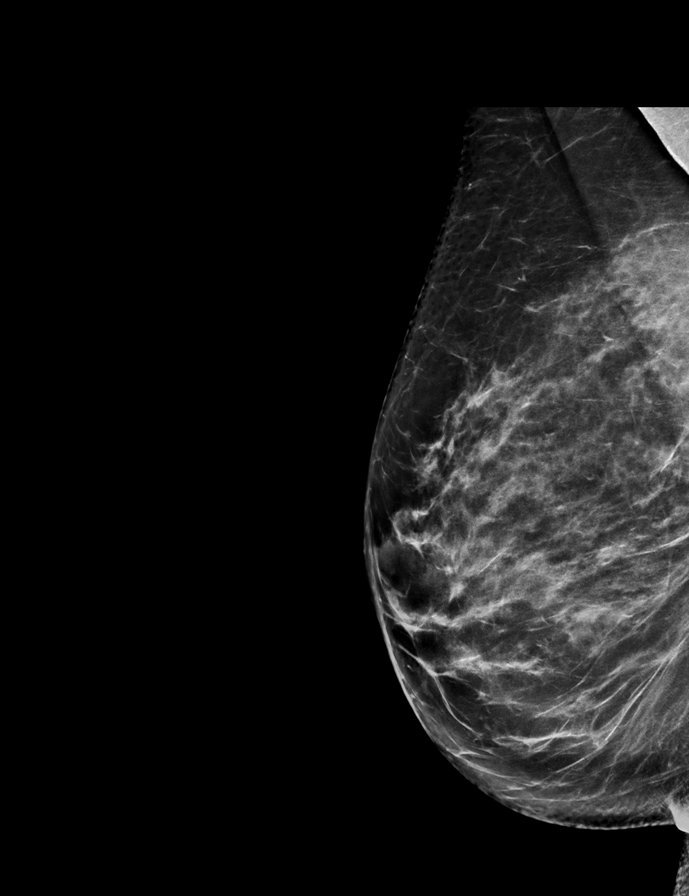

[L CC synth-2D]
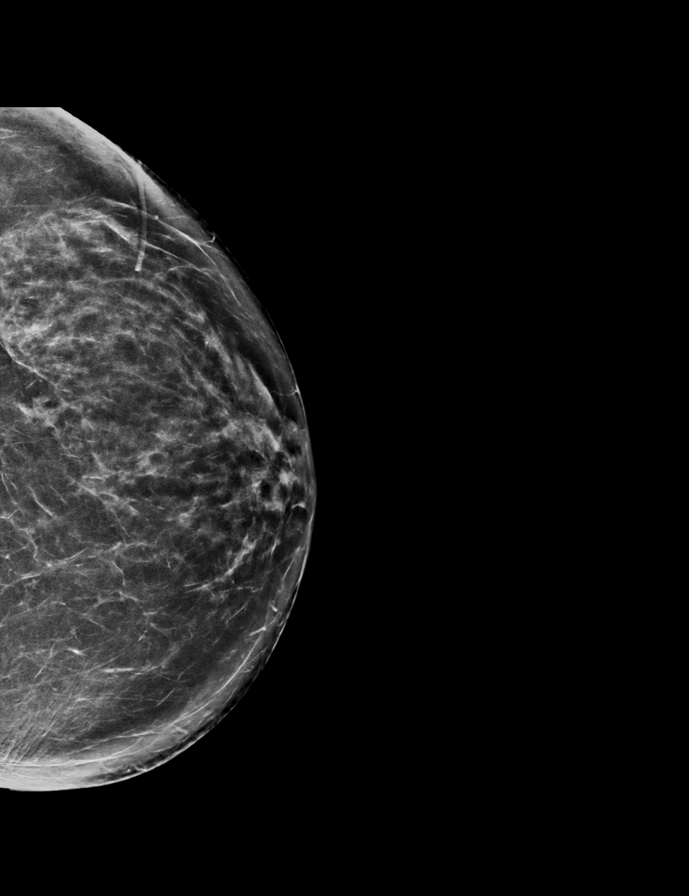

[L MLO synth-2D]
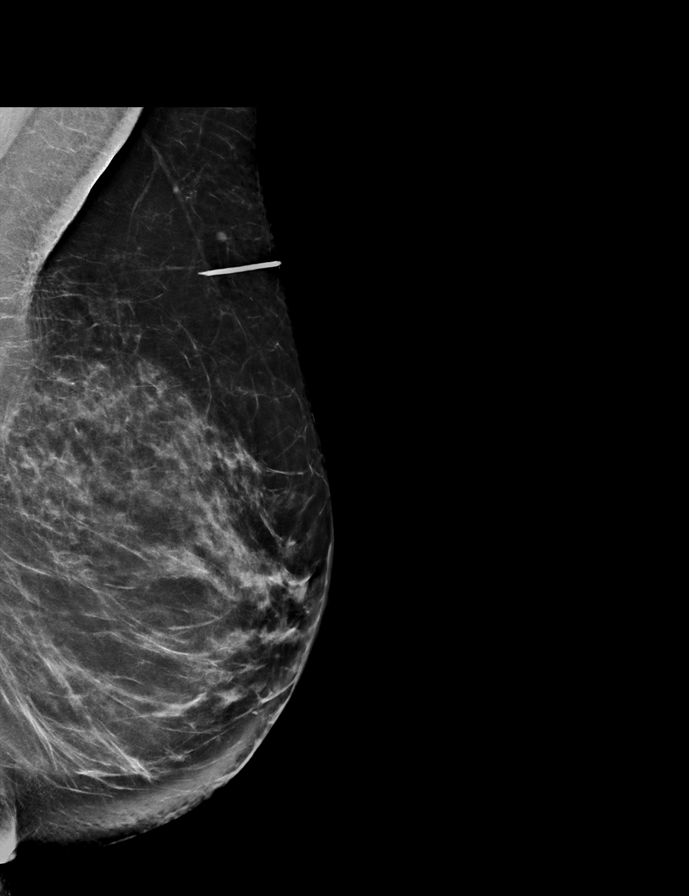

[R CC synth-2D]
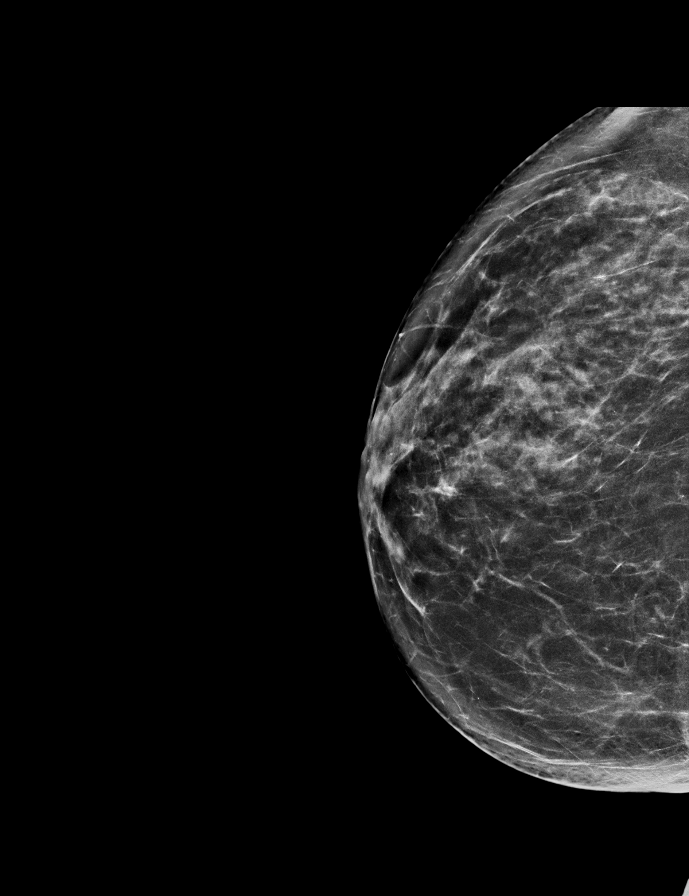

[L CC tomo · 2 of 77 frames shown]
[frame 25/77]
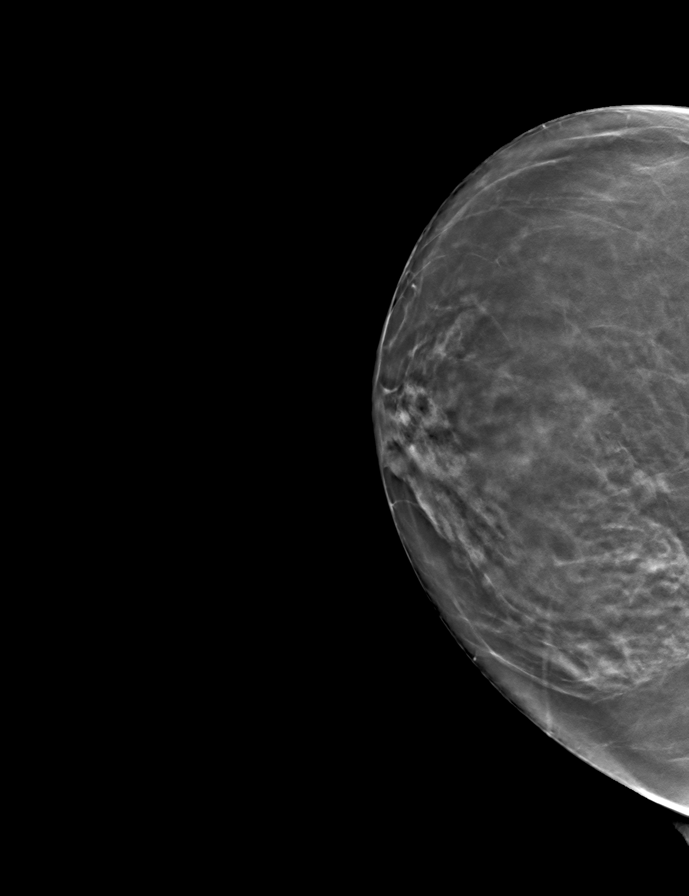
[frame 39/77]
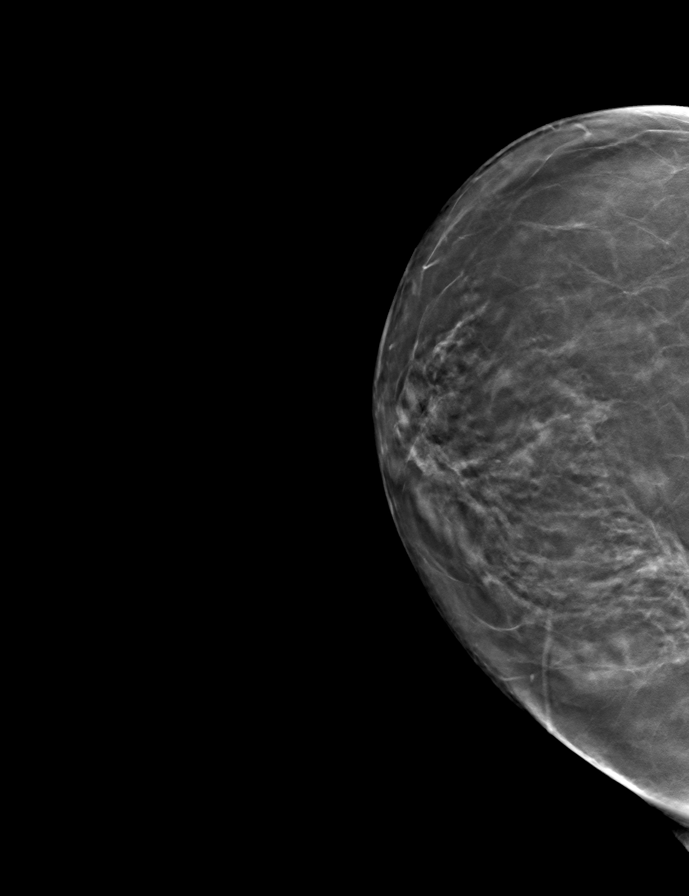

[R MLO tomo · tomo slice 39/78.0]
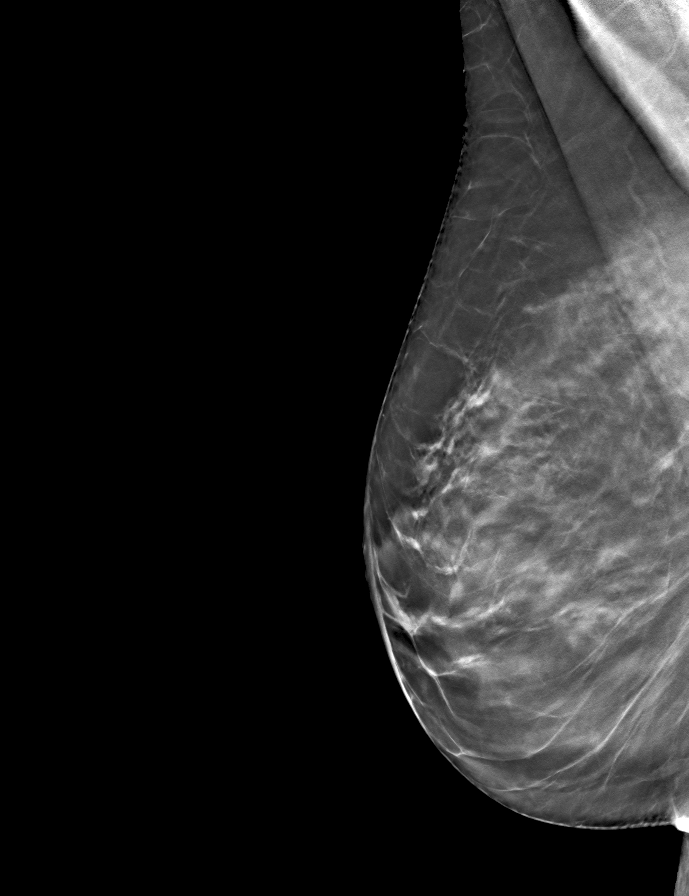

[L MLO tomo · tomo slice 40/79.0]
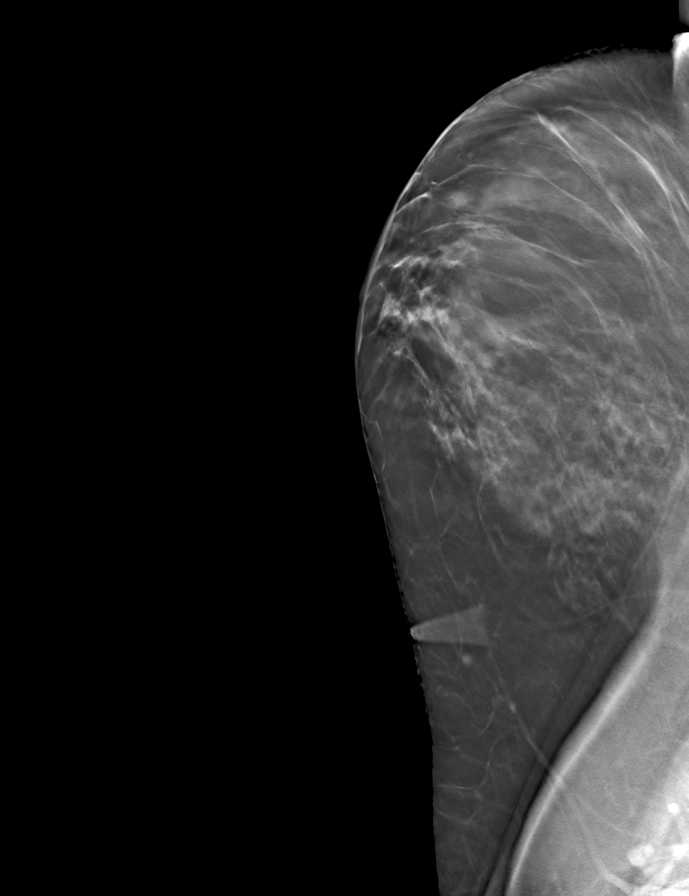

[R CC tomo · tomo slice 37/73.0]
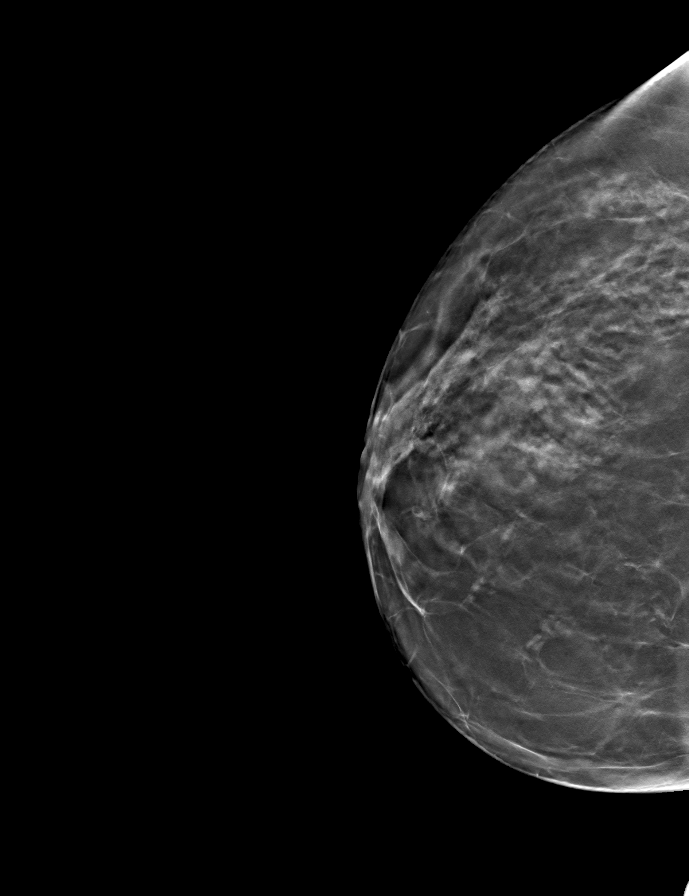

[9 of 24 positions shown; findings below may reference images not displayed]

ACR Breast Density Category c: The breast tissue is heterogeneously
dense, which may obscure small masses.
FINDINGS: There are no findings suspicious for malignancy.
IMPRESSION: No mammographic evidence of malignancy. A result letter of this
screening mammogram will be mailed directly to the patient.

RECOMMENDATION:
Screening mammogram in one year. (Code:Q3-W-BC3)

BI-RADS CATEGORY  1: Negative.

## 2024-01-07 ENCOUNTER — Ambulatory Visit: Payer: Medicare Other | Admitting: Internal Medicine

## 2024-02-05 ENCOUNTER — Ambulatory Visit (INDEPENDENT_AMBULATORY_CARE_PROVIDER_SITE_OTHER): Admitting: Internal Medicine

## 2024-02-05 ENCOUNTER — Encounter: Payer: Self-pay | Admitting: Internal Medicine

## 2024-02-05 VITALS — BP 122/70 | HR 76 | Resp 16 | Ht 65.0 in | Wt 112.2 lb

## 2024-02-05 DIAGNOSIS — Z860100 Personal history of colon polyps, unspecified: Secondary | ICD-10-CM

## 2024-02-05 DIAGNOSIS — M8580 Other specified disorders of bone density and structure, unspecified site: Secondary | ICD-10-CM

## 2024-02-05 DIAGNOSIS — R739 Hyperglycemia, unspecified: Secondary | ICD-10-CM

## 2024-02-05 DIAGNOSIS — I10 Essential (primary) hypertension: Secondary | ICD-10-CM | POA: Diagnosis not present

## 2024-02-05 NOTE — Progress Notes (Signed)
 Subjective:    [name], female    DOB: December 23, 1955, 68 y.o.   MRN: [id number]  Patient here for  Chief Complaint  Patient presents with   Medical Management of Chronic Issues    HPI Here for a scheduled follow up - follow up regarding anemia and hypercholesterolemia. Reports she is doing relatively well. Stays active. No chest pain or sob with increased activity or exertion. No abdominal pain or bowel change. Just had colonoscopy.    Past Medical History:  Diagnosis Date   Allergy    Colon polyps    Hearing loss, sensorineural 1993   HSV-1 (herpes simplex virus 1) infection    Hypertension    Osteopenia    Postmenopausal    Past Surgical History:  Procedure Laterality Date   BREAST BIOPSY  1977   Family History  Problem Relation Age of Onset   Heart disease Father        CHF   Diabetes Father    Breast cancer Neg Hx    Social History   Socioeconomic History   Marital status: Married    Spouse name: Not on file   Number of children: Not on file   Years of education: Not on file   Highest education level: Master's degree (e.g., MA, MS, MEng, MEd, MSW, MBA)  Occupational History   Not on file  Tobacco Use   Smoking status: Former    Current packs/day: 0.00    Average packs/day: 0.5 packs/day for 8.0 years (4.0 ttl pk-yrs)    Types: Cigarettes    Start date: 41    Quit date: 31    Years since quitting: 44.7   Smokeless tobacco: Never  Substance and Sexual Activity   Alcohol use: Yes    Alcohol/week: 0.0 standard drinks of alcohol   Drug use: No   Sexual activity: Not on file  Other Topics Concern   Not on file  Social History Narrative   Not on file   Social Drivers of Health   Financial Resource Strain: Low Risk  (08/27/2023)   Overall Financial Resource Strain (CARDIA)    Difficulty of Paying Living Expenses: Not hard at all  Food Insecurity: No Food Insecurity (08/23/2023)   Hunger Vital Sign    Worried About Running Out  of Food in the Last Year: Never true    Ran Out of Food in the Last Year: Never true  Transportation Needs: No Transportation Needs (08/23/2023)   PRAPARE - Administrator, Civil Service (Medical): No    Lack of Transportation (Non-Medical): No  Physical Activity: Sufficiently Active (08/27/2023)   Exercise Vital Sign    Days of Exercise per Week: 5 days    Minutes of Exercise per Session: 40 min  Stress: No Stress Concern Present (08/27/2023)   [employer] of Occupational Health - Occupational Stress Questionnaire    Feeling of Stress : Not at all  Social Connections: Socially Integrated (08/23/2023)   Social Connection and Isolation Panel    Frequency of Communication with Friends and Family: Twice a week    Frequency of Social Gatherings with Friends and Family: Once a week    Attends Religious Services: 1 to 4 times per year    Active Member of [employer] or Organizations: No    Attends Engineer, structural: More than 4 times per year    Marital Status: Married     Review of Systems  Constitutional:  Negative for appetite  change and unexpected weight change.  HENT:  Negative for congestion and sinus pressure.   Respiratory:  Negative for cough, chest tightness and shortness of breath.   Cardiovascular:  Negative for chest pain, palpitations and leg swelling.  Gastrointestinal:  Negative for abdominal pain, diarrhea, nausea and vomiting.  Genitourinary:  Negative for difficulty urinating and dysuria.  Musculoskeletal:  Negative for joint swelling and myalgias.  Skin:  Negative for color change and rash.  Neurological:  Negative for dizziness and headaches.  Psychiatric/Behavioral:  Negative for agitation and dysphoric mood.        Objective:     BP 122/70   Pulse 76   Resp 16   Ht 5' 5 (1.651 m)   Wt 112 lb 3.2 oz (50.9 kg)   LMP 07/26/2003   SpO2 98%   BMI 18.67 kg/m  Wt Readings from Last 3 Encounters:  02/05/24 112 lb 3.2 oz (50.9 kg)   08/27/23 113 lb (51.3 kg)  07/10/23 113 lb (51.3 kg)    Physical Exam Vitals reviewed.  Constitutional:      General: She is not in acute distress.    Appearance: Normal appearance.  HENT:     Head: Normocephalic and atraumatic.     Right Ear: External ear normal.     Left Ear: External ear normal.     Mouth/Throat:     Pharynx: No oropharyngeal exudate or posterior oropharyngeal erythema.  Eyes:     General: No scleral icterus.       Right eye: No discharge.        Left eye: No discharge.     Conjunctiva/sclera: Conjunctivae normal.  Neck:     Thyroid : No thyromegaly.  Cardiovascular:     Rate and Rhythm: Normal rate and regular rhythm.  Pulmonary:     Effort: No respiratory distress.     Breath sounds: Normal breath sounds. No wheezing.  Abdominal:     General: Bowel sounds are normal.     Palpations: Abdomen is soft.     Tenderness: There is no abdominal tenderness.  Musculoskeletal:        General: No swelling or tenderness.     Cervical back: Neck supple. No tenderness.  Lymphadenopathy:     Cervical: No cervical adenopathy.  Skin:    Findings: No erythema or rash.  Neurological:     Mental Status: She is alert.  Psychiatric:        Mood and Affect: Mood normal.        Behavior: Behavior normal.         Outpatient Encounter Medications as of 02/05/2024  Medication Sig   alendronate  (FOSAMAX ) 70 MG tablet TAKE 1 TABLET BY MOUTH EVERY 7 DAYS. TAKE WITH A FULL GLASS OF WATER ON AN EMPTY STOMACH.   Calcium  Citrate (CITRACAL PO) Take 1 tablet by mouth daily.   lisinopril  (ZESTRIL ) 10 MG tablet Take 1 tablet (10 mg total) by mouth daily.   Omega-3 Fatty Acids (FISH OIL) 600 MG CAPS Take 1 tablet by mouth daily.   rosuvastatin  (CRESTOR ) 5 MG tablet Take 1 tablet (5 mg total) by mouth daily.   triamcinolone (NASACORT) 55 MCG/ACT nasal inhaler Place 2 sprays into the nose daily.   No facility-administered encounter medications on file as of 02/05/2024.     Lab  Results  Component Value Date   WBC 5.8 01/05/2023   HGB 13.0 01/05/2023   HCT 40.3 01/05/2023   PLT 223.0 01/05/2023   GLUCOSE 75 07/10/2023  CHOL 158 07/10/2023   TRIG 47.0 07/10/2023   HDL 71.50 07/10/2023   LDLDIRECT 113.2 08/07/2012   LDLCALC 77 07/10/2023   ALT 17 07/10/2023   AST 20 07/10/2023   NA 139 07/10/2023   K 3.8 07/10/2023   CL 102 07/10/2023   CREATININE 0.88 07/10/2023   BUN 24 (H) 07/10/2023   CO2 30 07/10/2023   TSH 2.87 01/05/2023   HGBA1C 6.0 07/10/2023    MM 3D SCREENING MAMMOGRAM BILATERAL BREAST Result Date: 05/09/2023 CLINICAL DATA:  Screening. EXAM: DIGITAL SCREENING BILATERAL MAMMOGRAM WITH TOMOSYNTHESIS AND CAD TECHNIQUE: Bilateral screening digital craniocaudal and mediolateral oblique mammograms were obtained. Bilateral screening digital breast tomosynthesis was performed. The images were evaluated with computer-aided detection. COMPARISON:  Previous exam(s). ACR Breast Density Category b: There are scattered areas of fibroglandular density. FINDINGS: There are no findings suspicious for malignancy. IMPRESSION: No mammographic evidence of malignancy. A result letter of this screening mammogram will be mailed directly to the patient. RECOMMENDATION: Screening mammogram in one year. (Code:[id number]) BI-RADS CATEGORY  1: Negative. Electronically Signed   By: Dina  Arceo M.D.   On: 05/09/2023 16:32       Assessment & Plan:  Hypertension, essential Assessment & Plan: On lisinopril .  Blood pressure as outlined.  Follow pressures. Follow metabolic panel. No change in medication today.   Orders: -     CBC with Differential/Platelet; Future -     Basic metabolic panel with GFR; Future -     Hepatic function panel; Future -     Lipid panel; Future -     TSH; Future  Hyperglycemia Assessment & Plan: Check met b and A1c.   Orders: -     Hemoglobin A1c; Future -     Lipid panel; Future  Osteopenia, unspecified location  History of colonic  polyps Assessment & Plan: Colonoscopy 07/2018.  Reports no polyps.  Recommended f/u in 5 years. Colonoscopy 10/03/23 - diverticulosis and non bleeding internal hemorrhoids. Recommended f/u in 5 years.        Allena Hamilton, MD

## 2024-02-10 ENCOUNTER — Encounter: Payer: Self-pay | Admitting: Internal Medicine

## 2024-02-10 NOTE — Assessment & Plan Note (Signed)
 Colonoscopy 07/2018.  Reports no polyps.  Recommended f/u in 5 years. Colonoscopy 10/03/23 - diverticulosis and non bleeding internal hemorrhoids. Recommended f/u in 5 years.

## 2024-02-10 NOTE — Assessment & Plan Note (Signed)
 On lisinopril .  Blood pressure as outlined.  Follow pressures. Follow metabolic panel. No change in medication today.

## 2024-02-10 NOTE — Assessment & Plan Note (Signed)
 Check met b and A1c.

## 2024-02-12 ENCOUNTER — Other Ambulatory Visit (INDEPENDENT_AMBULATORY_CARE_PROVIDER_SITE_OTHER)

## 2024-02-12 DIAGNOSIS — I10 Essential (primary) hypertension: Secondary | ICD-10-CM | POA: Diagnosis not present

## 2024-02-12 DIAGNOSIS — R739 Hyperglycemia, unspecified: Secondary | ICD-10-CM

## 2024-02-12 LAB — CBC WITH DIFFERENTIAL/PLATELET
Basophils Absolute: 0.1 K/uL (ref 0.0–0.1)
Basophils Relative: 1.1 % (ref 0.0–3.0)
Eosinophils Absolute: 0.1 K/uL (ref 0.0–0.7)
Eosinophils Relative: 2.4 % (ref 0.0–5.0)
HCT: 42.4 % (ref 36.0–46.0)
Hemoglobin: 14 g/dL (ref 12.0–15.0)
Lymphocytes Relative: 31.6 % (ref 12.0–46.0)
Lymphs Abs: 1.8 K/uL (ref 0.7–4.0)
MCHC: 33 g/dL (ref 30.0–36.0)
MCV: 89.4 fl (ref 78.0–100.0)
Monocytes Absolute: 0.4 K/uL (ref 0.1–1.0)
Monocytes Relative: 7.7 % (ref 3.0–12.0)
Neutro Abs: 3.3 K/uL (ref 1.4–7.7)
Neutrophils Relative %: 57.2 % (ref 43.0–77.0)
Platelets: 260 K/uL (ref 150.0–400.0)
RBC: 4.75 Mil/uL (ref 3.87–5.11)
RDW: 14 % (ref 11.5–15.5)
WBC: 5.7 K/uL (ref 4.0–10.5)

## 2024-02-12 LAB — LIPID PANEL
Cholesterol: 169 mg/dL (ref 0–200)
HDL: 77.4 mg/dL (ref >39.00)
LDL Cholesterol: 80 mg/dL (ref 0–99)
NonHDL: 91.1
Total CHOL/HDL Ratio: 2
Triglycerides: 58 mg/dL (ref 0.0–149.0)
VLDL: 11.6 mg/dL (ref 0.0–40.0)

## 2024-02-12 LAB — BASIC METABOLIC PANEL WITH GFR
BUN: 22 mg/dL (ref 6–23)
CO2: 31 meq/L (ref 19–32)
Calcium: 9.6 mg/dL (ref 8.4–10.5)
Chloride: 101 meq/L (ref 96–112)
Creatinine, Ser: 0.88 mg/dL (ref 0.40–1.20)
GFR: 67.04 mL/min (ref >60.00)
Glucose, Bld: 82 mg/dL (ref 70–99)
Potassium: 4.3 meq/L (ref 3.5–5.1)
Sodium: 140 meq/L (ref 135–145)

## 2024-02-12 LAB — HEPATIC FUNCTION PANEL
ALT: 16 U/L (ref 0–35)
AST: 19 U/L (ref 0–37)
Albumin: 4.3 g/dL (ref 3.5–5.2)
Alkaline Phosphatase: 52 U/L (ref 39–117)
Bilirubin, Direct: 0.1 mg/dL (ref 0.0–0.3)
Total Bilirubin: 0.4 mg/dL (ref 0.2–1.2)
Total Protein: 6.7 g/dL (ref 6.0–8.3)

## 2024-02-12 LAB — TSH: TSH: 3.18 u[IU]/mL (ref 0.35–5.50)

## 2024-02-12 LAB — HEMOGLOBIN A1C: Hgb A1c MFr Bld: 6.3 % (ref 4.6–6.5)

## 2024-02-13 ENCOUNTER — Ambulatory Visit: Payer: Self-pay | Admitting: Internal Medicine

## 2024-03-24 ENCOUNTER — Other Ambulatory Visit: Payer: Self-pay | Admitting: Internal Medicine

## 2024-03-24 DIAGNOSIS — Z1231 Encounter for screening mammogram for malignant neoplasm of breast: Secondary | ICD-10-CM

## 2024-05-08 ENCOUNTER — Ambulatory Visit
Admission: RE | Admit: 2024-05-08 | Discharge: 2024-05-08 | Disposition: A | Source: Ambulatory Visit | Attending: Internal Medicine | Admitting: Internal Medicine

## 2024-05-08 DIAGNOSIS — Z1231 Encounter for screening mammogram for malignant neoplasm of breast: Secondary | ICD-10-CM | POA: Insufficient documentation

## 2024-06-05 ENCOUNTER — Other Ambulatory Visit: Payer: Self-pay | Admitting: Internal Medicine

## 2024-06-11 ENCOUNTER — Other Ambulatory Visit: Payer: Self-pay | Admitting: Internal Medicine

## 2024-08-05 ENCOUNTER — Ambulatory Visit: Admitting: Internal Medicine

## 2024-08-26 ENCOUNTER — Encounter
